# Patient Record
Sex: Female | Born: 1960 | Race: White | Hispanic: No | Marital: Single | Smoking: Never smoker
Health system: Southern US, Community
[De-identification: ages and names within clinical notes are randomized; demographics above are authoritative.]

## PROBLEM LIST (undated history)

## (undated) DIAGNOSIS — E079 Disorder of thyroid, unspecified: Secondary | ICD-10-CM

## (undated) DIAGNOSIS — I10 Essential (primary) hypertension: Secondary | ICD-10-CM

## (undated) HISTORY — PX: CHOLECYSTECTOMY: SHX55

---

## 2013-09-18 ENCOUNTER — Ambulatory Visit: Payer: Self-pay | Admitting: Physician Assistant

## 2016-06-23 ENCOUNTER — Ambulatory Visit (INDEPENDENT_AMBULATORY_CARE_PROVIDER_SITE_OTHER): Payer: 59

## 2016-06-23 ENCOUNTER — Encounter: Payer: Self-pay | Admitting: Emergency Medicine

## 2016-06-23 ENCOUNTER — Ambulatory Visit
Admission: EM | Admit: 2016-06-23 | Discharge: 2016-06-23 | Disposition: A | Discharge status: Home / Self Care | Payer: 59 | Attending: Emergency Medicine | Admitting: Emergency Medicine

## 2016-06-23 DIAGNOSIS — R062 Wheezing: Secondary | ICD-10-CM

## 2016-06-23 DIAGNOSIS — J22 Unspecified acute lower respiratory infection: Secondary | ICD-10-CM | POA: Diagnosis not present

## 2016-06-23 DIAGNOSIS — J9801 Acute bronchospasm: Secondary | ICD-10-CM

## 2016-06-23 HISTORY — DX: Disorder of thyroid, unspecified: E07.9

## 2016-06-23 HISTORY — DX: Essential (primary) hypertension: I10

## 2016-06-23 MED ORDER — AZITHROMYCIN 250 MG PO TABS: 250.0000 mg | ORAL_TABLET | Freq: Every day | ORAL | 0 refills | Status: DC

## 2016-06-23 MED ORDER — BENZONATATE 200 MG PO CAPS: 200.0000 mg | ORAL_CAPSULE | Freq: Three times a day (TID) | ORAL | 0 refills | Status: DC | PRN

## 2016-06-23 MED ORDER — IPRATROPIUM-ALBUTEROL 0.5-2.5 (3) MG/3ML IN SOLN
3.0000 mL | Freq: Once | RESPIRATORY_TRACT | Status: AC
Start: 2016-06-23 — End: 2016-06-23
  Administered 2016-06-23: 3 mL via RESPIRATORY_TRACT

## 2016-06-23 MED ORDER — ALBUTEROL SULFATE HFA 108 (90 BASE) MCG/ACT IN AERS: 1.0000 | INHALATION_SPRAY | RESPIRATORY_TRACT | 0 refills | Status: DC | PRN

## 2016-06-23 MED ORDER — HYDROCOD POLST-CPM POLST ER 10-8 MG/5ML PO SUER: 5.0000 mL | Freq: Two times a day (BID) | ORAL | 0 refills | Status: DC | PRN

## 2016-06-23 MED ORDER — AEROCHAMBER PLUS MISC: 2 refills | Status: DC

## 2016-06-23 MED ORDER — PREDNISONE 10 MG (21) PO TBPK: ORAL_TABLET | ORAL | 0 refills | Status: DC

## 2016-06-23 MED ORDER — FLUTICASONE PROPIONATE 50 MCG/ACT NA SUSP: 2.0000 | Freq: Every day | NASAL | 0 refills | Status: DC

## 2016-06-23 NOTE — ED Provider Notes (Signed)
HPI  SUBJECTIVE:  Donna Petersen is a 56 y.o. female who presents with cough productive of yellow mucous, chest congestion the past week. She reports clear nasal congestion, rhinorrhea, occasional sneezing. No itchy, watery eyes. She reports sinus pressure, states that her ears feel clogged and reports decreased hearing. She reports occasional left-sided ear pain. She reports wheezing at night, shortness of breath yesterday shortness of breath with exertion. She had a right-sided headache 2 days ago but states that this is since resolved. She reports fatigue secondary to coughing states that she is unable to sleep due to the cough. States that her boyfriend has identical symptoms. No fevers, recent antibiotics, antipyretic in the past 6-8 hours. No chest pain. No dental pain, facial swelling. No GERD symptoms. She tried Mucinex and increasing fluids. There are no alleviating factors. Symptoms are worse with going out to the pollen. She has a past medical history of atypical/walking pneumonia, hypertension, hypothyroidism and depression. She also has a history of allergies which get bad around this time year for which she takes Zyrtec regularly. No history of asthma, emphysema, COPD, smoking, diabetes. PMD: [facility].  Past Medical History:  Diagnosis Date  . Hypertension   . Thyroid disease     Past Surgical History:  Procedure Laterality Date  . CHOLECYSTECTOMY      History reviewed. No pertinent family history.  Social History  Substance Use Topics  . Smoking status: Never Smoker  . Smokeless tobacco: Never Used  . Alcohol use Yes    No current facility-administered medications for this encounter.   Current Outpatient Prescriptions:  .  levothyroxine (SYNTHROID, LEVOTHROID) 100 MCG tablet, Take 100 mcg by mouth daily before breakfast., Disp: , Rfl:  .  lisinopril (PRINIVIL,ZESTRIL) 10 MG tablet, Take 10 mg by mouth daily., Disp: , Rfl:  .  sertraline (ZOLOFT) 50 MG tablet, Take  50 mg by mouth daily., Disp: , Rfl:  .  albuterol (PROVENTIL HFA;VENTOLIN HFA) 108 (90 Base) MCG/ACT inhaler, Inhale 1-2 puffs into the lungs every 4 (four) hours as needed for wheezing or shortness of breath., Disp: 1 Inhaler, Rfl: 0 .  azithromycin (ZITHROMAX) 250 MG tablet, Take 1 tablet (250 mg total) by mouth daily. 2 tabs po on day 1, 1 tab po on days 2-5, Disp: 6 tablet, Rfl: 0 .  benzonatate (TESSALON) 200 MG capsule, Take 1 capsule (200 mg total) by mouth 3 (three) times daily as needed for cough., Disp: 30 capsule, Rfl: 0 .  chlorpheniramine-HYDROcodone (TUSSIONEX PENNKINETIC ER) 10-8 MG/5ML SUER, Take 5 mLs by mouth every 12 (twelve) hours as needed for cough., Disp: 120 mL, Rfl: 0 .  fluticasone (FLONASE) 50 MCG/ACT nasal spray, Place 2 sprays into both nostrils daily., Disp: 16 g, Rfl: 0 .  predniSONE (STERAPRED UNI-PAK 21 TAB) 10 MG (21) TBPK tablet, Dispense one 6 day pack. Take as directed with food., Disp: 21 tablet, Rfl: 0 .  Spacer/Aero-Holding Chambers (AEROCHAMBER PLUS) inhaler, Use as instructed, Disp: 1 each, Rfl: 2  Allergies  Allergen Reactions  . Latex Anaphylaxis     ROS  As noted in HPI.   Physical Exam  BP 112/70 (BP Location: Right Arm)   Pulse 77   Temp 98.4 F (36.9 C) (Oral)   Resp 16   Ht 5\' 6"  (1.676 m)   Wt 160 lb (72.6 kg)   SpO2 96%   BMI 25.82 kg/m   Constitutional: Well developed, well nourished, no acute distress Eyes:  EOMI, conjunctiva normal bilaterally HENT:  Normocephalic, atraumatic,mucus membranes moist. Positive clear rhinorrhea, erythematous, swollen turbinates. No sinus tenderness. TMs normal with no air-fluid levels or effusion. Positive cobblestoning, no obvious postnasal drip  Respiratory: Normal inspiratory effort, scattered rhonchi and occasional wheezing. prolonged expiratory phase. Fair air movement. No rales. No chest wall tenderness Cardiovascular: Normal rate regular rhythm no murmurs rubs or gallops GI:  nondistended skin: No rash, skin intact Musculoskeletal: no deformities Neurologic: Alert & oriented x 3, no focal neuro deficits Psychiatric: Speech and behavior appropriate   ED Course   Medications  ipratropium-albuterol (DUONEB) 0.5-2.5 (3) MG/3ML nebulizer solution 3 mL (3 mLs Nebulization Given 06/23/16 1120)    Orders Placed This Encounter  Procedures  . DG Chest 2 View    Standing Status:   Standing    Number of Occurrences:   1    Order Specific Question:   Reason for Exam (SYMPTOM  OR DIAGNOSIS REQUIRED)    Answer:   r/o PNA    No results found for this or any previous visit (from the past 24 hour(s)). Dg Chest 2 View  Result Date: 06/23/2016 CLINICAL DATA:  Cough and congestion for 1 week. EXAM: CHEST  2 VIEW COMPARISON:  None. FINDINGS: The cardiomediastinal silhouette is unremarkable. There is no evidence of focal airspace disease, pulmonary edema, suspicious pulmonary nodule/mass, pleural effusion, or pneumothorax. No acute bony abnormalities are identified. IMPRESSION: No active cardiopulmonary disease. Electronically Signed   By: Harmon Pier M.D.   On: 06/23/2016 11:56    ED Clinical Impression  LRTI (lower respiratory tract infection)  Bronchospasm   ED Assessment/Plan  Presentation consistent with bronchitis/lower respiratory tract infection. This could also be bronchospasm from her allergies. She has no evidence of sinusitis or otitis. We will check a chest x-ray to rule out pneumonia. Also giving DuoNeb here because of the wheezing and rhonchi. We will reevaluate.  X-ray independently reviewed. Negative for pneumonia. See radiology report for details.  on reevaluation, patient states that she feels significantly better. Improved air movement. Positive expiratory rhonchi and wheezing bilaterally.  Plan to send home with Flonase, Tussionex, Tessalon, albuterol inhaler with a spacer. She will discontinue the Mucinex because it has not been working for her to  restart her Zyrtec. We'll send home with some prednisone, and a wait-and-see prescription of azithromycin if she does not get better with these medicines. Follow-up with PMD in 3 days. Go to the ER if she gets worse.   Discussed imaging, MDM, plan and followup with patient. Discussed sn/sx that should prompt return to the ED. Patient agrees with plan.   Meds ordered this encounter  Medications  . lisinopril (PRINIVIL,ZESTRIL) 10 MG tablet    Sig: Take 10 mg by mouth daily.  Marland Kitchen levothyroxine (SYNTHROID, LEVOTHROID) 100 MCG tablet    Sig: Take 100 mcg by mouth daily before breakfast.  . sertraline (ZOLOFT) 50 MG tablet    Sig: Take 50 mg by mouth daily.  Marland Kitchen ipratropium-albuterol (DUONEB) 0.5-2.5 (3) MG/3ML nebulizer solution 3 mL  . albuterol (PROVENTIL HFA;VENTOLIN HFA) 108 (90 Base) MCG/ACT inhaler    Sig: Inhale 1-2 puffs into the lungs every 4 (four) hours as needed for wheezing or shortness of breath.    Dispense:  1 Inhaler    Refill:  0  . Spacer/Aero-Holding Chambers (AEROCHAMBER PLUS) inhaler    Sig: Use as instructed    Dispense:  1 each    Refill:  2  . predniSONE (STERAPRED UNI-PAK 21 TAB) 10 MG (21) TBPK tablet  Sig: Dispense one 6 day pack. Take as directed with food.    Dispense:  21 tablet    Refill:  0  . chlorpheniramine-HYDROcodone (TUSSIONEX PENNKINETIC ER) 10-8 MG/5ML SUER    Sig: Take 5 mLs by mouth every 12 (twelve) hours as needed for cough.    Dispense:  120 mL    Refill:  0  . benzonatate (TESSALON) 200 MG capsule    Sig: Take 1 capsule (200 mg total) by mouth 3 (three) times daily as needed for cough.    Dispense:  30 capsule    Refill:  0  . fluticasone (FLONASE) 50 MCG/ACT nasal spray    Sig: Place 2 sprays into both nostrils daily.    Dispense:  16 g    Refill:  0  . azithromycin (ZITHROMAX) 250 MG tablet    Sig: Take 1 tablet (250 mg total) by mouth daily. 2 tabs po on day 1, 1 tab po on days 2-5    Dispense:  6 tablet    Refill:  0    *This  clinic note was created using Scientist, clinical (histocompatibility and immunogenetics). Therefore, there may be occasional mistakes despite careful proofreading.  ?   Domenick Gong, MD 06/23/16 1215

## 2016-06-23 NOTE — ED Triage Notes (Signed)
Patient c/o cough and chest congestion for a week.  

## 2016-06-23 NOTE — Discharge Instructions (Signed)
Flonase and saline nasal irrigation for your nasal congestion and postnasal drip. Tussionex for the cough at night, Tessalon for cough during the day, 1-2 puffs from your albuterol inhaler with the spacer. As needed for coughing and wheezing.  discontinue the Mucinex because it has not been working for you to restart your Zyrtec. Try the prednisone as well. If you're not getting better with all of these medicines, then start the azithromycin. Follow-up with your PMD in 3 days. Go to the ER if you get worse.

## 2016-07-12 ENCOUNTER — Ambulatory Visit
Admission: EM | Admit: 2016-07-12 | Discharge: 2016-07-12 | Disposition: A | Discharge status: Home / Self Care | Payer: 59 | Attending: Internal Medicine | Admitting: Internal Medicine

## 2016-07-12 ENCOUNTER — Ambulatory Visit (INDEPENDENT_AMBULATORY_CARE_PROVIDER_SITE_OTHER): Payer: 59

## 2016-07-12 ENCOUNTER — Ambulatory Visit: Payer: 59

## 2016-07-12 DIAGNOSIS — T07XXXA Unspecified multiple injuries, initial encounter: Secondary | ICD-10-CM

## 2016-07-12 DIAGNOSIS — S2241XA Multiple fractures of ribs, right side, initial encounter for closed fracture: Secondary | ICD-10-CM | POA: Diagnosis not present

## 2016-07-12 DIAGNOSIS — R0789 Other chest pain: Secondary | ICD-10-CM

## 2016-07-12 MED ORDER — HYDROCODONE-ACETAMINOPHEN 5-325 MG PO TABS: 1.0000 | ORAL_TABLET | Freq: Four times a day (QID) | ORAL | 0 refills | Status: DC | PRN

## 2016-07-12 MED ORDER — NAPROXEN 500 MG PO TABS: 500.0000 mg | ORAL_TABLET | Freq: Two times a day (BID) | ORAL | 0 refills | Status: AC

## 2016-07-12 NOTE — Discharge Instructions (Signed)
-  Naprosyn 500mg : twice a day as needed for pain -Norco: every 6 hours as needed for pain not relieved with naprosyn -deep breathing exercises to help prevent pneumonia -avoid aggravating movements -follow up with PCP -can return to clinic or PCP should symptoms not improve or worsen or you develop fever. -report to ER should you develop shortness of breath
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
## 2016-12-12 ENCOUNTER — Encounter: Payer: Self-pay | Admitting: Emergency Medicine

## 2016-12-12 ENCOUNTER — Ambulatory Visit
Admission: EM | Admit: 2016-12-12 | Discharge: 2016-12-12 | Disposition: A | Discharge status: Home / Self Care | Payer: 59 | Attending: Family Medicine | Admitting: Family Medicine

## 2016-12-12 DIAGNOSIS — J029 Acute pharyngitis, unspecified: Secondary | ICD-10-CM

## 2016-12-12 DIAGNOSIS — R05 Cough: Secondary | ICD-10-CM

## 2016-12-12 DIAGNOSIS — J069 Acute upper respiratory infection, unspecified: Secondary | ICD-10-CM

## 2016-12-12 MED ORDER — AZITHROMYCIN 250 MG PO TABS: ORAL_TABLET | ORAL | 0 refills | Status: DC

## 2016-12-12 MED ORDER — HYDROCOD POLST-CPM POLST ER 10-8 MG/5ML PO SUER
5.0000 mL | Freq: Two times a day (BID) | ORAL | 0 refills | Status: DC
Start: 2016-12-12 — End: 2020-05-14

## 2016-12-12 MED ORDER — FLUTICASONE PROPIONATE 50 MCG/ACT NA SUSP: 2.0000 | Freq: Every day | NASAL | 0 refills | Status: DC

## 2016-12-12 MED ORDER — BENZONATATE 200 MG PO CAPS: ORAL_CAPSULE | ORAL | 0 refills | Status: DC

## 2016-12-12 NOTE — ED Triage Notes (Signed)
Patient c/o cough and chest congestion since Saturday.   

## 2016-12-12 NOTE — ED Provider Notes (Addendum)
[facility]    CSN: [id number] Arrival date & time: 12/12/16  0857     History   Chief Complaint Chief Complaint  Patient presents with  . Cough    HPI Donna Petersen is a 56 y.o. female.   HPI  56 year old female presents with cough and congestion that started on Saturday 5 days prior to this visit. Not had any fever or chills. The cough and mucous discharge is productive of greenish sputum. She is a nonsmoker. Her boyfriend has the same symptoms. She is having difficulty sleeping at night because of the significant coughing. She is tending to sleep during the daytime in a semi-reclining position. Been taking Mucinex and over-the-counter preparations but they have not been effective.        Past Medical History:  Diagnosis Date  . Hypertension   . Thyroid disease     There are no active problems to display for this patient.   Past Surgical History:  Procedure Laterality Date  . CHOLECYSTECTOMY      OB History    No data available       Home Medications    Prior to Admission medications   Medication Sig Start Date End Date Taking? Authorizing Provider  albuterol (PROVENTIL HFA;VENTOLIN HFA) 108 (90 Base) MCG/ACT inhaler Inhale 1-2 puffs into the lungs every 4 (four) hours as needed for wheezing or shortness of breath. 06/23/16   Domenick Gong, MD  azithromycin (ZITHROMAX Z-PAK) 250 MG tablet Use as per package instructions 12/12/16   Lutricia Feil, PA-C  benzonatate (TESSALON) 200 MG capsule Take one cap TID PRN cough 12/12/16   Lutricia Feil, PA-C  chlorpheniramine-HYDROcodone [facility]) 10-8 MG/5ML SUER Take 5 mLs by mouth 2 (two) times daily. 12/12/16   Lutricia Feil, PA-C  fluticasone (FLONASE) 50 MCG/ACT nasal spray Place 2 sprays into both nostrils daily. 12/12/16   Lutricia Feil, PA-C  fluticasone (FLONASE) 50 MCG/ACT nasal spray Place 2 sprays into both nostrils daily. 12/12/16   Lutricia Feil, PA-C    HYDROcodone-acetaminophen (NORCO) 5-325 MG tablet Take 1 tablet by mouth every 6 (six) hours as needed for moderate pain or severe pain (not relieved with naprosyn). 07/12/16   Candis Schatz, PA-C  levothyroxine (SYNTHROID, LEVOTHROID) 100 MCG tablet Take 100 mcg by mouth daily before breakfast.    [provider]  lisinopril (PRINIVIL,ZESTRIL) 10 MG tablet Take 10 mg by mouth daily.    [provider]  naproxen (NAPROSYN) 500 MG tablet Take 1 tablet (500 mg total) by mouth 2 (two) times daily with a meal. 07/12/16   Candis Schatz, PA-C  sertraline (ZOLOFT) 50 MG tablet Take 50 mg by mouth daily.    [provider]  Spacer/Aero-Holding Chambers (AEROCHAMBER PLUS) inhaler Use as instructed 06/23/16   Domenick Gong, MD    Family History History reviewed. No pertinent family history.  Social History Social History  Substance Use Topics  . Smoking status: Never Smoker  . Smokeless tobacco: Never Used  . Alcohol use Yes     Allergies   Latex   Review of Systems Review of Systems  Constitutional: Positive for activity change. Negative for chills, fatigue and fever.  HENT: Positive for congestion, postnasal drip, rhinorrhea, sinus pain, sinus pressure and sore throat.   Respiratory: Positive for cough. Negative for shortness of breath, wheezing and stridor.   All other systems reviewed and are negative.    Physical Exam Triage Vital Signs ED Triage  Vitals  Enc Vitals Group     BP 12/12/16 0918 140/73     Pulse Rate 12/12/16 0918 64     Resp 12/12/16 0918 14     Temp 12/12/16 0918 98.3 F (36.8 C)     Temp Source 12/12/16 0918 Oral     SpO2 12/12/16 0918 99 %     Weight 12/12/16 0916 162 lb 3.2 oz (73.6 kg)     Height 12/12/16 0916 5\' 6"  (1.676 m)     Head Circumference --      Peak Flow --      Pain Score 12/12/16 0916 0     Pain Loc --      Pain Edu? --      Excl. in GC? --    No data found.   Updated Vital Signs BP 140/73 (BP  Location: Left Arm)   Pulse 64   Temp 98.3 F (36.8 C) (Oral)   Resp 14   Ht 5\' 6"  (1.676 m)   Wt 162 lb 3.2 oz (73.6 kg)   SpO2 99%   BMI 26.18 kg/m   Visual Acuity Right Eye Distance:   Left Eye Distance:   Bilateral Distance:    Right Eye Near:   Left Eye Near:    Bilateral Near:     Physical Exam  Constitutional: She is oriented to person, place, and time. She appears well-developed and well-nourished. No distress.  HENT:  Head: Normocephalic.  Right Ear: External ear normal.  Left Ear: External ear normal.  Nose: Nose normal.  Mouth/Throat: Oropharynx is clear and moist. No oropharyngeal exudate.  Eyes: Pupils are equal, round, and reactive to light. Right eye exhibits no discharge. Left eye exhibits no discharge.  Neck: Normal range of motion.  Pulmonary/Chest: Effort normal and breath sounds normal.  Musculoskeletal: Normal range of motion.  Lymphadenopathy:    She has no cervical adenopathy.  Neurological: She is alert and oriented to person, place, and time.  Skin: Skin is warm and dry. She is not diaphoretic.  Psychiatric: She has a normal mood and affect. Her behavior is normal. Judgment and thought content normal.  Nursing note and vitals reviewed.    UC Treatments / Results  Labs (all labs ordered are listed, but only abnormal results are displayed) Labs Reviewed - No data to display  EKG  EKG Interpretation None       Radiology No results found.  Procedures Procedures (including critical care time)  Medications Ordered in UC Medications - No data to display   Initial Impression / Assessment and Plan / UC Course  I have reviewed the triage vital signs and the nursing notes.  Pertinent labs & imaging results that were available during my care of the patient were reviewed by me and considered in my medical decision making (see chart for details).     Plan: 1. Test/x-ray results and diagnosis reviewed with patient 2. rx as per orders;  risks, benefits, potential side effects reviewed with patient 3. Recommend supportive treatment with rest and fluids. Use Tussionex at nighttime for rest. She is cautioned regarding performing activities are her concentration are judgment and not driving while taking the medication. Also prescribed azithromycin that she may take in a 45 days if she continues to have symptoms. I told her this is very likely a viral illness that will run its course however. If she is not improving she should follow-up with her primary care physician. 4. F/u prn if symptoms worsen or  don't improve   Final Clinical Impressions(s) / UC Diagnoses   Final diagnoses:  Upper respiratory tract infection, unspecified type    New Prescriptions Discharge Medication List as of 12/12/2016  9:54 AM    START taking these medications   Details  azithromycin (ZITHROMAX Z-PAK) 250 MG tablet Use as per package instructions, Normal    benzonatate (TESSALON) 200 MG capsule Take one cap TID PRN cough, Normal    chlorpheniramine-HYDROcodone (TUSSIONEX PENNKINETIC ER) 10-8 MG/5ML SUER Take 5 mLs by mouth 2 (two) times daily., Starting Thu 12/12/2016, Print         Controlled Substance Prescriptions [place] Controlled Substance Registry consulted? Not Applicable   Lutricia FeilRoemer, Esperansa Sarabia P, PA-C 12/12/16 1003    Lutricia Feiloemer, Tinita Brooker P, PA-C 12/12/16 1004

## 2016-12-23 ENCOUNTER — Encounter: Payer: Self-pay | Admitting: *Deleted

## 2016-12-23 ENCOUNTER — Ambulatory Visit
Admission: EM | Admit: 2016-12-23 | Discharge: 2016-12-23 | Disposition: A | Discharge status: Home / Self Care | Payer: Self-pay | Attending: Family Medicine | Admitting: Family Medicine

## 2016-12-23 DIAGNOSIS — J029 Acute pharyngitis, unspecified: Secondary | ICD-10-CM

## 2016-12-23 DIAGNOSIS — H6503 Acute serous otitis media, bilateral: Secondary | ICD-10-CM

## 2016-12-23 DIAGNOSIS — R05 Cough: Secondary | ICD-10-CM

## 2016-12-23 DIAGNOSIS — J0100 Acute maxillary sinusitis, unspecified: Secondary | ICD-10-CM

## 2016-12-23 LAB — RAPID STREP SCREEN (MED CTR MEBANE ONLY): Streptococcus, Group A Screen (Direct): NEGATIVE

## 2016-12-23 MED ORDER — AMOXICILLIN 875 MG PO TABS: 875.0000 mg | ORAL_TABLET | Freq: Two times a day (BID) | ORAL | 0 refills | Status: DC

## 2016-12-23 NOTE — ED Triage Notes (Signed)
Sore throat, non-productive cough, head and chest congestion, x2 weeks. Denies fever.

## 2016-12-23 NOTE — ED Provider Notes (Signed)
[facility]    CSN: [id number] Arrival date & time: 12/23/16  1048     History   Chief Complaint Chief Complaint  Patient presents with  . Sore Throat  . Cough  . Nasal Congestion    HPI Donna Petersen is a 56 y.o. female.   The history is provided by the patient.  Sore Throat   Cough  Associated symptoms: ear pain and sore throat   Associated symptoms: no wheezing   URI  Presenting symptoms: congestion, cough, ear pain, facial pain and sore throat   Severity:  Moderate Onset quality:  Sudden Duration:  1 week Timing:  Constant Progression:  Worsening Chronicity:  New Relieved by:  Nothing Ineffective treatments:  OTC medications Associated symptoms: sinus pain   Associated symptoms: no wheezing     Past Medical History:  Diagnosis Date  . Hypertension   . Thyroid disease     There are no active problems to display for this patient.   Past Surgical History:  Procedure Laterality Date  . CHOLECYSTECTOMY      OB History    No data available       Home Medications    Prior to Admission medications   Medication Sig Start Date End Date Taking? Authorizing Provider  fluticasone (FLONASE) 50 MCG/ACT nasal spray Place 2 sprays into both nostrils daily. 12/12/16  Yes Lutricia Feil, PA-C  levothyroxine (SYNTHROID, LEVOTHROID) 100 MCG tablet Take 100 mcg by mouth daily before breakfast.   Yes [provider]  lisinopril (PRINIVIL,ZESTRIL) 10 MG tablet Take 10 mg by mouth daily.   Yes [provider]  sertraline (ZOLOFT) 50 MG tablet Take 50 mg by mouth daily.   Yes [provider]  albuterol (PROVENTIL HFA;VENTOLIN HFA) 108 (90 Base) MCG/ACT inhaler Inhale 1-2 puffs into the lungs every 4 (four) hours as needed for wheezing or shortness of breath. 06/23/16   Domenick Gong, MD  amoxicillin (AMOXIL) 875 MG tablet Take 1 tablet (875 mg total) 2 (two) times daily by mouth. 12/23/16   Payton Mccallum, MD  azithromycin  (ZITHROMAX Z-PAK) 250 MG tablet Use as per package instructions 12/12/16   Lutricia Feil, PA-C  benzonatate (TESSALON) 200 MG capsule Take one cap TID PRN cough 12/12/16   Lutricia Feil, PA-C  chlorpheniramine-HYDROcodone (TUSSIONEX PENNKINETIC ER) 10-8 MG/5ML SUER Take 5 mLs by mouth 2 (two) times daily. 12/12/16   Lutricia Feil, PA-C  fluticasone (FLONASE) 50 MCG/ACT nasal spray Place 2 sprays into both nostrils daily. 12/12/16   Lutricia Feil, PA-C  HYDROcodone-acetaminophen (NORCO) 5-325 MG tablet Take 1 tablet by mouth every 6 (six) hours as needed for moderate pain or severe pain (not relieved with naprosyn). 07/12/16   Candis Schatz, PA-C  naproxen (NAPROSYN) 500 MG tablet Take 1 tablet (500 mg total) by mouth 2 (two) times daily with a meal. 07/12/16   Candis Schatz, PA-C  Spacer/Aero-Holding Chambers (AEROCHAMBER PLUS) inhaler Use as instructed 06/23/16   Domenick Gong, MD    Family History History reviewed. No pertinent family history.  Social History Social History   Tobacco Use  . Smoking status: Never Smoker  . Smokeless tobacco: Never Used  Substance Use Topics  . Alcohol use: Yes  . Drug use: No     Allergies   Latex   Review of Systems Review of Systems  HENT: Positive for congestion, ear pain, sinus pain and sore throat.   Respiratory: Positive for cough. Negative for wheezing.  Physical Exam Triage Vital Signs ED Triage Vitals  Enc Vitals Group     BP 12/23/16 1137 (!) 175/87     Pulse Rate 12/23/16 1137 65     Resp 12/23/16 1137 16     Temp 12/23/16 1137 98.7 F (37.1 C)     Temp Source 12/23/16 1137 Oral     SpO2 12/23/16 1137 100 %     Weight --      Height --      Head Circumference --      Peak Flow --      Pain Score 12/23/16 1140 4     Pain Loc --      Pain Edu? --      Excl. in GC? --    No data found.  Updated Vital Signs BP (!) 175/87 (BP Location: Left Arm)   Pulse 65   Temp 98.7 F (37.1 C) (Oral)    Resp 16   SpO2 100%   Visual Acuity Right Eye Distance:   Left Eye Distance:   Bilateral Distance:    Right Eye Near:   Left Eye Near:    Bilateral Near:     Physical Exam  Constitutional: She appears well-developed and well-nourished. No distress.  HENT:  Head: Normocephalic and atraumatic.  Right Ear: External ear and ear canal normal. Tympanic membrane is erythematous and bulging.  Left Ear: External ear and ear canal normal. Tympanic membrane is erythematous and bulging.  Nose: Mucosal edema and rhinorrhea present. No nose lacerations, sinus tenderness, nasal deformity, septal deviation or nasal septal hematoma. No epistaxis.  No foreign bodies. Right sinus exhibits maxillary sinus tenderness and frontal sinus tenderness. Left sinus exhibits maxillary sinus tenderness and frontal sinus tenderness.  Mouth/Throat: Uvula is midline, oropharynx is clear and moist and mucous membranes are normal. No oropharyngeal exudate.  Eyes: Conjunctivae and EOM are normal. Pupils are equal, round, and reactive to light. Right eye exhibits no discharge. Left eye exhibits no discharge. No scleral icterus.  Neck: Normal range of motion. Neck supple. No thyromegaly present.  Cardiovascular: Normal rate, regular rhythm and normal heart sounds.  Pulmonary/Chest: Effort normal and breath sounds normal. No respiratory distress. She has no wheezes. She has no rales.  Lymphadenopathy:    She has no cervical adenopathy.  Skin: She is not diaphoretic.  Nursing note and vitals reviewed.    UC Treatments / Results  Labs (all labs ordered are listed, but only abnormal results are displayed) Labs Reviewed  RAPID STREP SCREEN (NOT AT [facility])  CULTURE, GROUP A STREP [facility])    EKG  EKG Interpretation None       Radiology No results found.  Procedures Procedures (including critical care time)  Medications Ordered in UC Medications - No data to display   Initial Impression / Assessment and Plan  / UC Course  I have reviewed the triage vital signs and the nursing notes.  Pertinent labs & imaging results that were available during my care of the patient were reviewed by me and considered in my medical decision making (see chart for details).       Final Clinical Impressions(s) / UC Diagnoses   Final diagnoses:  Acute maxillary sinusitis, recurrence not specified  Bilateral acute serous otitis media, recurrence not specified    New Prescriptions This SmartLink is deprecated. Use AVSMEDLIST instead to display the medication list for a patient.   1. diagnosis reviewed with patient 2. rx as per orders above; reviewed possible side effects,  interactions, risks and benefits  3.  Follow-up prn if symptoms worsen or don't improve Controlled Substance Prescriptions [place] Controlled Substance Registry consulted? Not Applicable   Payton Mccallum, MD 12/23/16 1309

## 2016-12-26 LAB — CULTURE, GROUP A STREP (THRC)

## 2020-05-14 ENCOUNTER — Encounter: Payer: Self-pay | Admitting: Emergency Medicine

## 2020-05-14 ENCOUNTER — Ambulatory Visit (INDEPENDENT_AMBULATORY_CARE_PROVIDER_SITE_OTHER): Payer: Self-pay

## 2020-05-14 ENCOUNTER — Ambulatory Visit
Admission: EM | Admit: 2020-05-14 | Discharge: 2020-05-14 | Disposition: A | Discharge status: Home / Self Care | Payer: Self-pay | Attending: Family Medicine | Admitting: Family Medicine

## 2020-05-14 ENCOUNTER — Other Ambulatory Visit: Payer: Self-pay

## 2020-05-14 DIAGNOSIS — S82852A Displaced trimalleolar fracture of left lower leg, initial encounter for closed fracture: Secondary | ICD-10-CM

## 2020-05-14 NOTE — ED Triage Notes (Signed)
Patient states that she was getting out of her car last night and rolled her left ankle.  Patient c/o swelling and pain in her left ankle.

## 2020-05-14 NOTE — ED Provider Notes (Signed)
[facility]    CSN: [id number] Arrival date & time: 05/14/20  0930      History   Chief Complaint Chief Complaint  Patient presents with  . Ankle Pain    left    HPI Donna Petersen is a 60 y.o. female.   HPI   60 year old female here for evaluation of left ankle pain.  Patient reports that she was getting out of her car last night and rolled her ankle.  She states that she was unable to bear weight after the incident and is still unable to bear weight.  Patient also has numbness and tingling in her toes.  Past Medical History:  Diagnosis Date  . Hypertension   . Thyroid disease     There are no problems to display for this patient.   Past Surgical History:  Procedure Laterality Date  . CHOLECYSTECTOMY      OB History   No obstetric history on file.      Home Medications    Prior to Admission medications   Medication Sig Start Date End Date Taking? Authorizing Provider  levothyroxine (SYNTHROID, LEVOTHROID) 100 MCG tablet Take 100 mcg by mouth daily before breakfast.   Yes [provider]  lisinopril (PRINIVIL,ZESTRIL) 10 MG tablet Take 10 mg by mouth daily.   Yes [provider]  sertraline (ZOLOFT) 50 MG tablet Take 50 mg by mouth daily.   Yes [provider]  benzonatate (TESSALON) 200 MG capsule Take one cap TID PRN cough 12/12/16   Lutricia Feil, PA-C  naproxen (NAPROSYN) 500 MG tablet Take 1 tablet (500 mg total) by mouth 2 (two) times daily with a meal. 07/12/16   Candis Schatz, PA-C  albuterol (PROVENTIL HFA;VENTOLIN HFA) 108 (90 Base) MCG/ACT inhaler Inhale 1-2 puffs into the lungs every 4 (four) hours as needed for wheezing or shortness of breath. 06/23/16 05/14/20  Domenick Gong, MD  fluticasone (FLONASE) 50 MCG/ACT nasal spray Place 2 sprays into both nostrils daily. 12/12/16 05/14/20  Lutricia Feil, PA-C    Family History History reviewed. No pertinent family history.  Social History Social  History   Tobacco Use  . Smoking status: Never Smoker  . Smokeless tobacco: Never Used  Vaping Use  . Vaping Use: Never used  Substance Use Topics  . Alcohol use: Yes  . Drug use: No     Allergies   Latex   Review of Systems Review of Systems  Musculoskeletal: Positive for arthralgias and joint swelling.  Skin: Positive for color change. Negative for wound.  Neurological: Positive for numbness. Negative for weakness.  Hematological: Negative.   Psychiatric/Behavioral: Negative.      Physical Exam Triage Vital Signs ED Triage Vitals [05/14/20 0939]  Enc Vitals Group     BP      Pulse      Resp      Temp      Temp src      SpO2      Weight 155 lb (70.3 kg)     Height 5\' 6"  (1.676 m)     Head Circumference      Peak Flow      Pain Score 8     Pain Loc      Pain Edu?      Excl. in GC?    No data found.  Updated Vital Signs BP (!) 142/73 (BP Location: Left Arm)   Pulse 72   Temp 98 F (36.7 C) (Oral)  Resp 15   Ht 5\' 6"  (1.676 m)   Wt 155 lb (70.3 kg)   SpO2 99%   BMI 25.02 kg/m   Visual Acuity Right Eye Distance:   Left Eye Distance:   Bilateral Distance:    Right Eye Near:   Left Eye Near:    Bilateral Near:     Physical Exam Vitals and nursing note reviewed.  Constitutional:      General: She is in acute distress.     Appearance: Normal appearance.  HENT:     Head: Normocephalic and atraumatic.  Cardiovascular:     Rate and Rhythm: Normal rate and regular rhythm.     Pulses: Normal pulses.     Heart sounds: Normal heart sounds. No murmur heard. No gallop.   Pulmonary:     Effort: Pulmonary effort is normal.     Breath sounds: Normal breath sounds. No wheezing, rhonchi or rales.  Musculoskeletal:        General: Swelling and tenderness present. No deformity or signs of injury.  Skin:    General: Skin is warm and dry.     Capillary Refill: Capillary refill takes less than 2 seconds.     Findings: Bruising present. No erythema.   Neurological:     General: No focal deficit present.     Mental Status: She is alert and oriented to person, place, and time.  Psychiatric:        Mood and Affect: Mood normal.        Behavior: Behavior normal.        Thought Content: Thought content normal.        Judgment: Judgment normal.      UC Treatments / Results  Labs (all labs ordered are listed, but only abnormal results are displayed) Labs Reviewed - No data to display  EKG   Radiology DG Tibia/Fibula Left  Result Date: 05/14/2020 CLINICAL DATA:  Rolled ankle last night, pain and swelling LEFT ankle, unable to bear weight EXAM: LEFT TIBIA AND FIBULA - 2 VIEW COMPARISON:  LEFT ankle radiographs 05/14/2020 FINDINGS: Displaced trimalleolar fractures LEFT ankle as seen on ankle radiographs. Remainder of tibia and fibula appear intact. Knee joint alignment normal. No additional fracture or dislocation. IMPRESSION: Displaced trimalleolar fractures LEFT ankle. No additional LEFT tibial or fibular abnormalities. Electronically Signed   By: 05/16/2020 M.D.   On: 05/14/2020 10:15   DG Ankle Complete Left  Result Date: 05/14/2020 CLINICAL DATA:  Rolled ankle last night, pain and swelling, greatest pain medially, pain radiates up LEFT leg, unable to bear weight EXAM: LEFT ANKLE COMPLETE - 3+ VIEW COMPARISON:  None FINDINGS: Osseous mineralization normal. Soft tissue swelling diffusely at LEFT ankle extending up lower leg. Transverse fracture medial malleolus, displaced laterally. Comminuted oblique lateral malleolar fracture, displaced laterally and slightly posteriorly. Lateral subluxation of talus. Minimally displaced posterior malleolar fracture. No dislocation. Tarsals intact. IMPRESSION: Mildly displaced trimalleolar fractures LEFT ankle as above. Electronically Signed   By: 05/16/2020 M.D.   On: 05/14/2020 10:14    Procedures Procedures (including critical care time)  Medications Ordered in UC Medications - No data to  display  Initial Impression / Assessment and Plan / UC Course  I have reviewed the triage vital signs and the nursing notes.  Pertinent labs & imaging results that were available during my care of the patient were reviewed by me and considered in my medical decision making (see chart for details).   Patient is a very pleasant  60 year old female who appears to be in a great deal of pain and is here for evaluation of left ankle pain.  She reports that she was getting out of her car last night and rolled her ankle in an inverted manner.  Patient reports that she tried to bear weight but was unable to after the incident and she is still unable to bear weight on her foot and ankle now.  Patient states that she has had some numbness and tingling in her toes but she has taken her sock off to look to see if there is any bruising.  She does state that it feels swollen.  Physical exam reveals a normal cardiopulmonary exam.  PT and DP pulses are 2+.  Patient's midfoot distal to the toes are cool to the touch, red, have a 6-second cap refill, and patient is complaining of tingling in her toes.  Patient has full range of motion of her toes.  Patient has ecchymosis over both malleoli and across the anterior aspect of the talar joint.  Patient has tenderness to palpation and compression of bilateral malleoli and with palpation of the talar joint.  Patient has no tenderness over midfoot with palpation and no tenderness of the head of the fifth metatarsal.  Patient does have tenderness to her distal tibia but no deformity or ecchymosis is noted.  Will obtain radiograph of left ankle and tib-fib.  Tib-fib films independently evaluated by me.  Interpretation: Patient has a comminuted distal left tibial fracture and a displaced distal medial malleolar fracture.  Remainder of tibia and fibula are intact.  Awaiting radiology overread.  Left ankle films independently evaluated by me.  Interpretation: The talus appears to be  intact.  There is comminuted fracture of the distal tibia with the anterior and posterior aspects fractured and displaced as well as a comminuted fracture of the distal fibula.  Awaiting radiology overread.  Patient's exam and imaging is consistent with a trimalleolar fracture.  This coupled with the decreased capillary refill of her distal foot indicating possible vascular compromise indicates necessity for urgent orthopedic evaluation.  Will place patient in a short leg splint and give her crutches.  Patient will be referred to the emergency department for orthopedic evaluation.  Interpretation of films is consistent with my read that this is a trimalleolar fracture.  They are reading it as mildly displaced.  Patient has elected to go to [facility] emergency department for orthopedic evaluation and definitive treatment.   Final Clinical Impressions(s) / UC Diagnoses   Final diagnoses:  Closed displaced trimalleolar fracture of left ankle, initial encounter     Discharge Instructions     Please go to the emergency department at [facility] for evaluation by orthopedics and definitive treatment of your left ankle fracture.    ED Prescriptions    None     PDMP not reviewed this encounter.   Becky Augusta, NP 05/14/20 1054

## 2020-05-14 NOTE — Discharge Instructions (Signed)
Please go to the emergency department at [facility] for evaluation by orthopedics and definitive treatment of your left ankle fracture.

## 2021-07-10 ENCOUNTER — Ambulatory Visit
Admission: EM | Admit: 2021-07-10 | Discharge: 2021-07-10 | Disposition: A | Discharge status: Home / Self Care | Payer: Self-pay | Attending: Physician Assistant | Admitting: Physician Assistant

## 2021-07-10 DIAGNOSIS — M25552 Pain in left hip: Secondary | ICD-10-CM

## 2021-07-10 MED ORDER — TIZANIDINE HCL 4 MG PO TABS: 4.0000 mg | ORAL_TABLET | Freq: Two times a day (BID) | ORAL | 0 refills | Status: AC | PRN
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
## 2021-07-10 NOTE — Discharge Instructions (Signed)
-  As we discussed, your hip pain could be due to lumbar radiculopathy or a pinched nerve in your back.  This definitely happen if you have been sitting for prolonged period of time which you have.  The other possibility is some medical bursitis of the hip. - Both conditions are treated similarly.  I have advised you to continue the naproxen 2 times a day as needed for pain relief.  Can also add Tylenol up to 1 g 3 times a day.  Can try heat and ice, muscle rubs, lidocaine patches.  I have sent a muscle relaxer to the pharmacy.  I think this can definitely help you sleep at night. - If no improvement in the next couple of weeks or worsening symptoms including increased radiation of pain down the leg, numbness, weakness in the leg, incontinence of bowel or bladder, you need to go to the ER.  Otherwise if not improving, follow-up with [facility] or your PCP.

## 2023-02-20 IMAGING — CR DG TIBIA/FIBULA 2V*L*
2 series · 2 of 2 positions shown · non-contrast
Comparison: LEFT ankle radiographs 05/14/2020

CLINICAL DATA: Rolled ankle last night, pain and swelling LEFT
ankle, unable to bear weight

EXAM:
LEFT TIBIA AND FIBULA - 2 VIEW

[tibia ap]
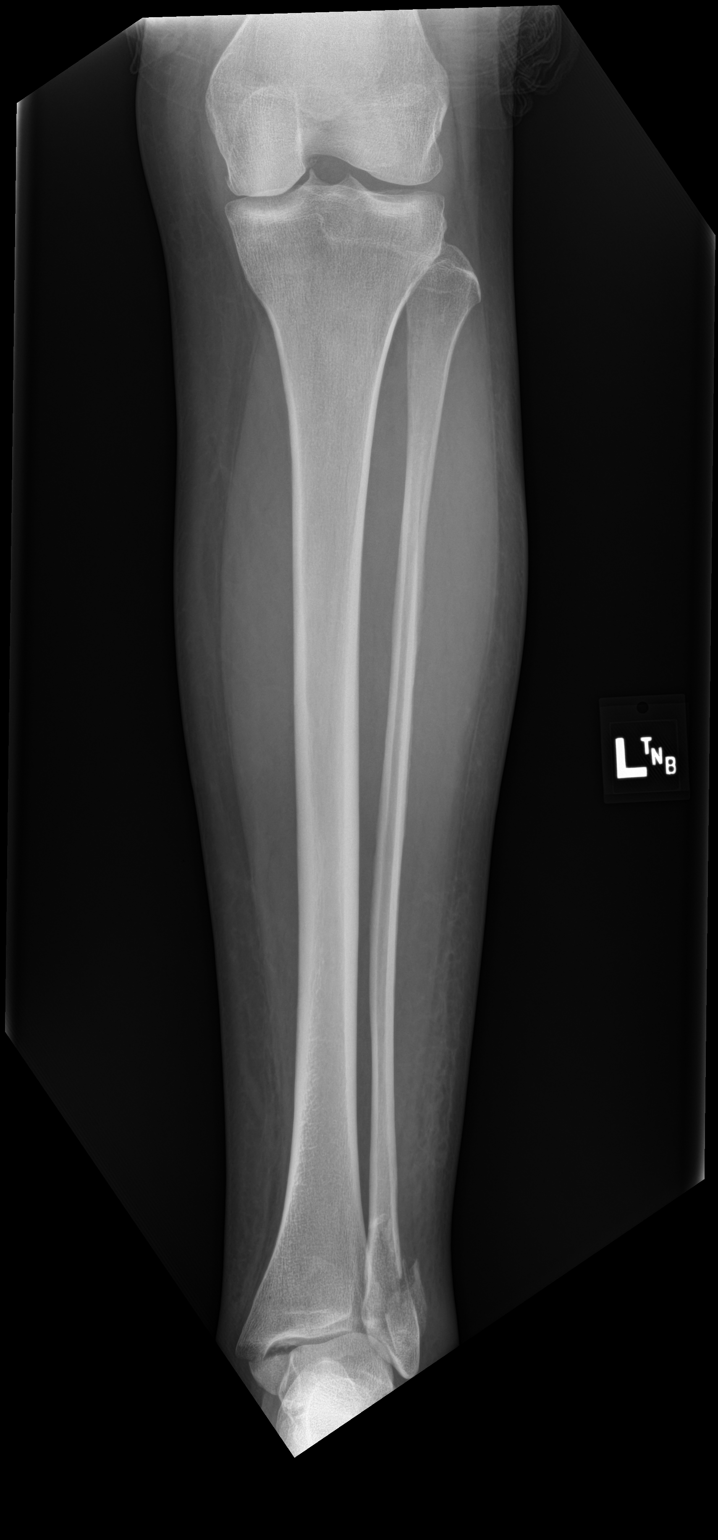

[tibia lat]
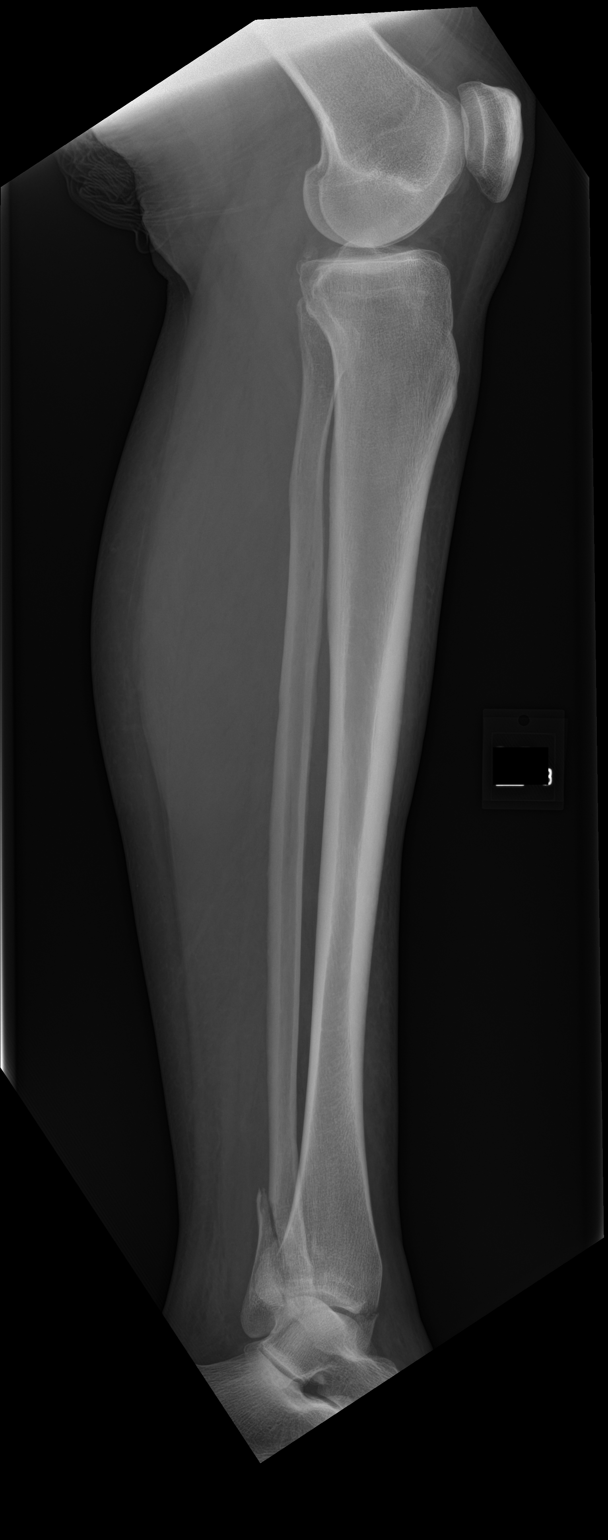

[2 of 2 positions shown; findings below may reference images not displayed]

FINDINGS: Displaced trimalleolar fractures LEFT ankle as seen on ankle
radiographs.

Remainder of tibia and fibula appear intact.

Knee joint alignment normal.

No additional fracture or dislocation.
IMPRESSION: Displaced trimalleolar fractures LEFT ankle.

No additional LEFT tibial or fibular abnormalities.

## 2023-02-20 IMAGING — CR DG ANKLE COMPLETE 3+V*L*
3 series · 3 of 3 positions shown · non-contrast
Comparison: None

CLINICAL DATA: Rolled ankle last night, pain and swelling, greatest
pain medially, pain radiates up LEFT leg, unable to bear weight

EXAM:
LEFT ANKLE COMPLETE - 3+ VIEW

[ankle ap]
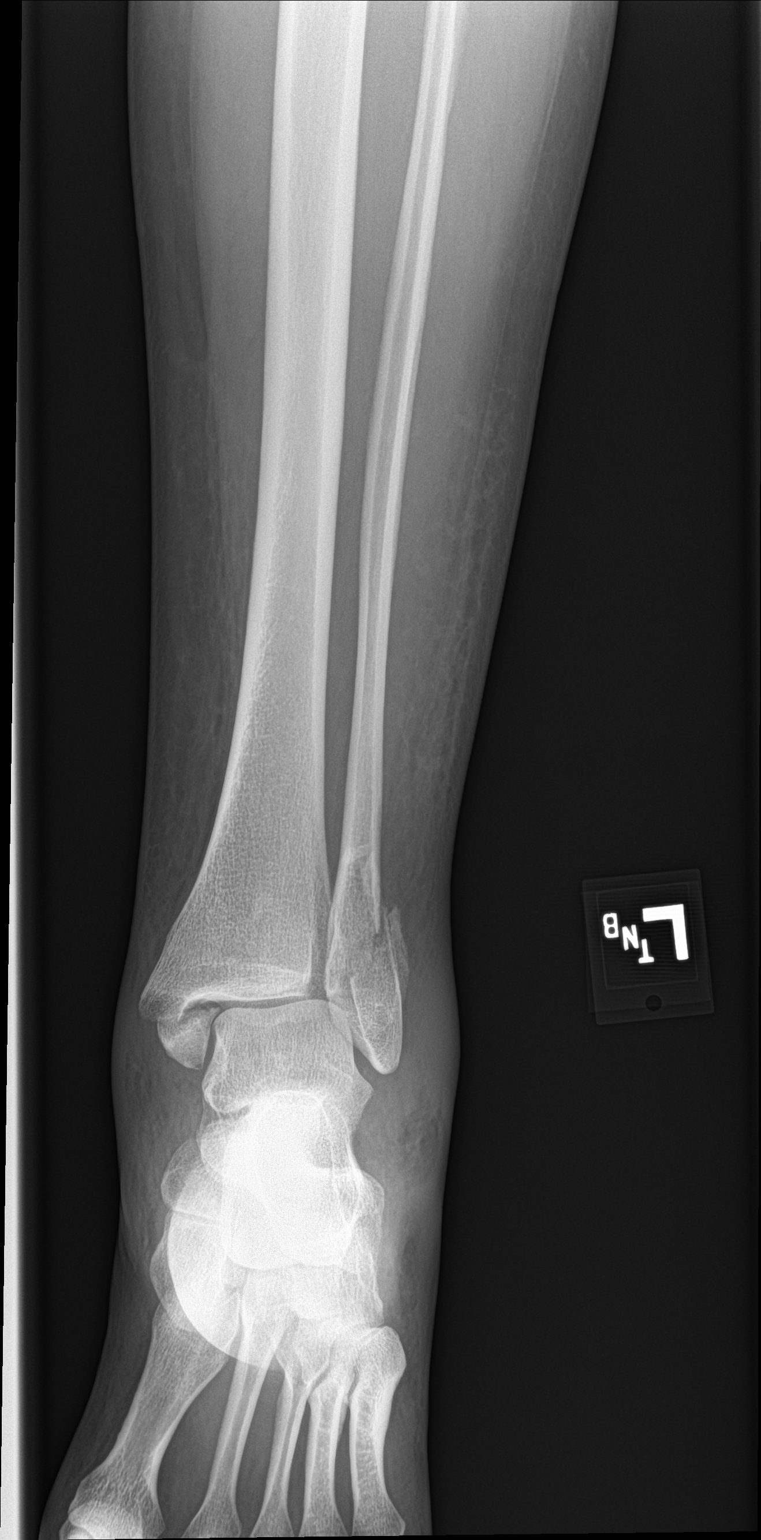

[ankle obl]
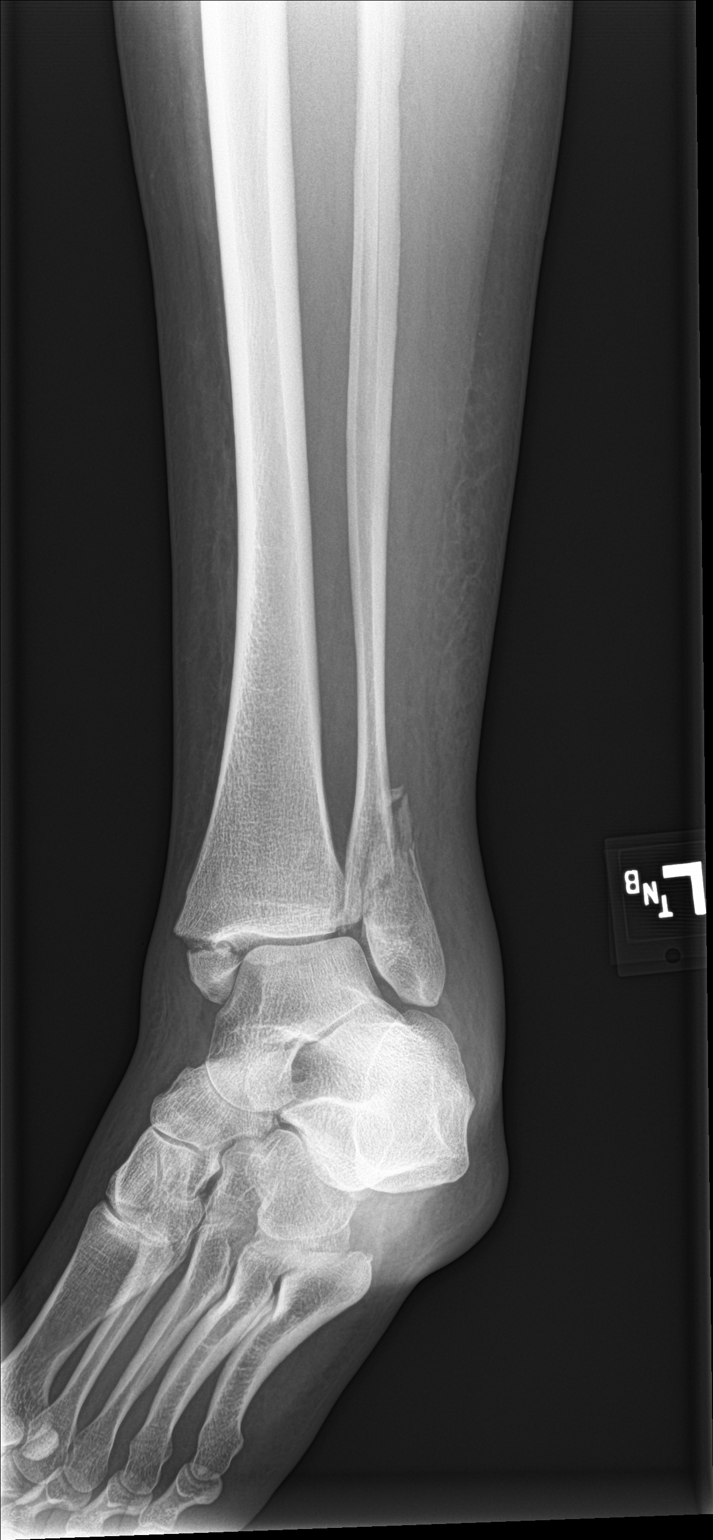

[ankle lat]
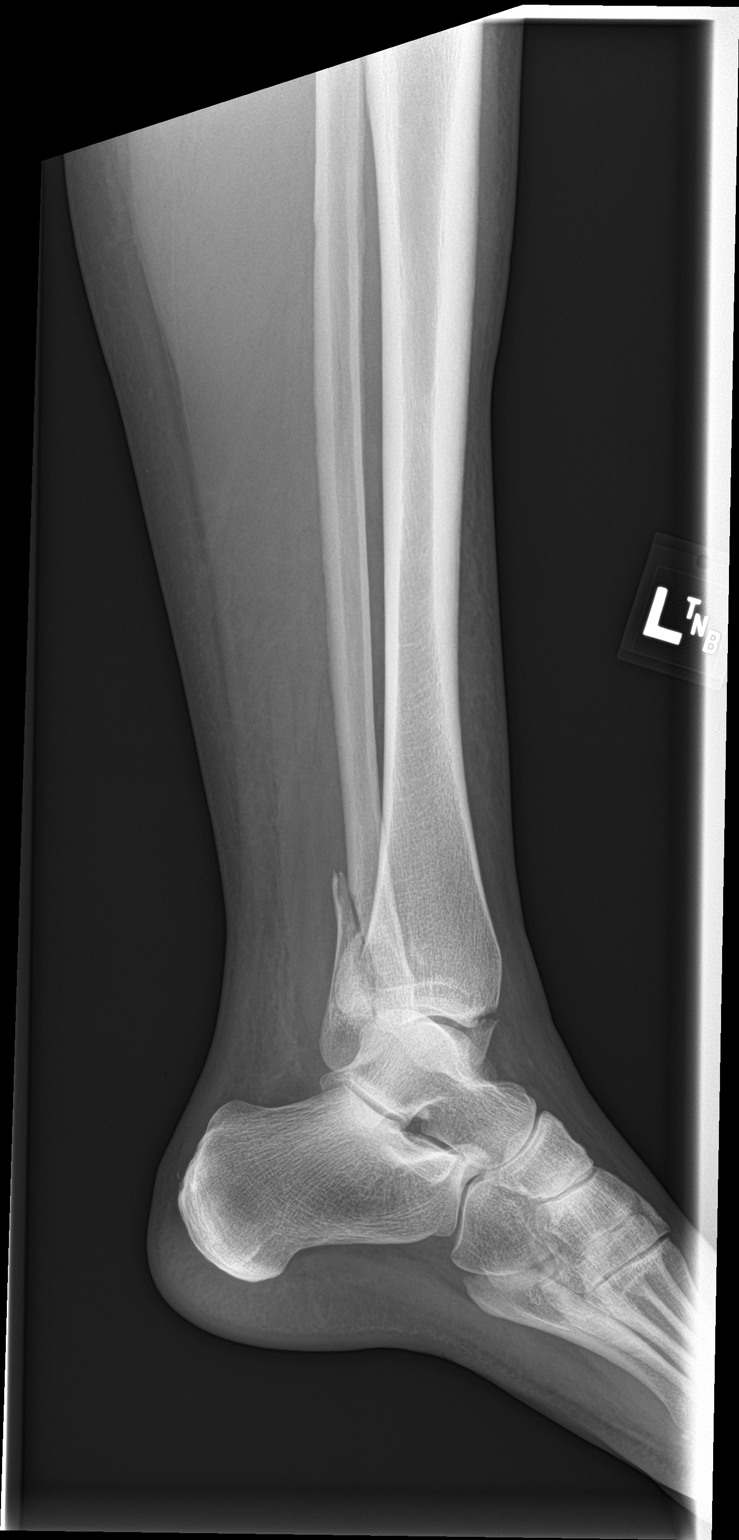

[3 of 3 positions shown; findings below may reference images not displayed]

FINDINGS: Osseous mineralization normal.

Soft tissue swelling diffusely at LEFT ankle extending up lower leg.

Transverse fracture medial malleolus, displaced laterally.

Comminuted oblique lateral malleolar fracture, displaced laterally
and slightly posteriorly.

Lateral subluxation of talus.

Minimally displaced posterior malleolar fracture.

No dislocation.

Tarsals intact.
IMPRESSION: Mildly displaced trimalleolar fractures LEFT ankle as above.

## 2023-03-07 ENCOUNTER — Other Ambulatory Visit: Payer: Self-pay

## 2023-03-07 ENCOUNTER — Emergency Department: Payer: Self-pay

## 2023-03-07 DIAGNOSIS — R4689 Other symptoms and signs involving appearance and behavior: Secondary | ICD-10-CM | POA: Diagnosis present

## 2023-03-07 DIAGNOSIS — F1010 Alcohol abuse, uncomplicated: Secondary | ICD-10-CM | POA: Insufficient documentation

## 2023-03-07 DIAGNOSIS — R45851 Suicidal ideations: Secondary | ICD-10-CM | POA: Diagnosis not present

## 2023-03-07 DIAGNOSIS — Z91040 Latex allergy status: Secondary | ICD-10-CM | POA: Insufficient documentation

## 2023-03-07 DIAGNOSIS — I10 Essential (primary) hypertension: Secondary | ICD-10-CM | POA: Diagnosis not present

## 2023-03-07 DIAGNOSIS — Y908 Blood alcohol level of 240 mg/100 ml or more: Secondary | ICD-10-CM | POA: Insufficient documentation

## 2023-03-07 LAB — COMPREHENSIVE METABOLIC PANEL
ALT: 36 U/L (ref 0–44)
AST: 62 U/L — ABNORMAL HIGH (ref 15–41)
Albumin: 4.5 g/dL (ref 3.5–5.0)
Alkaline Phosphatase: 58 U/L (ref 38–126)
Anion gap: 11 (ref 5–15)
BUN: 12 mg/dL (ref 8–23)
CO2: 23 mmol/L (ref 22–32)
Calcium: 9.1 mg/dL (ref 8.9–10.3)
Chloride: 107 mmol/L (ref 98–111)
Creatinine, Ser: 0.66 mg/dL (ref 0.44–1.00)
GFR, Estimated: >60 mL/min (ref >60)
Glucose, Bld: 103 mg/dL — ABNORMAL HIGH (ref 70–99)
Potassium: 4.1 mmol/L (ref 3.5–5.1)
Sodium: 141 mmol/L (ref 135–145)
Total Bilirubin: 0.8 mg/dL (ref 0.0–1.2)
Total Protein: 7.3 g/dL (ref 6.5–8.1)

## 2023-03-07 LAB — CBC
HCT: 42.5 % (ref 36.0–46.0)
Hemoglobin: 14 g/dL (ref 12.0–15.0)
MCH: 30.9 pg (ref 26.0–34.0)
MCHC: 32.9 g/dL (ref 30.0–36.0)
MCV: 93.8 fL (ref 80.0–100.0)
Platelets: 343 10*3/uL (ref 150–400)
RBC: 4.53 MIL/uL (ref 3.87–5.11)
RDW: 12.5 % (ref 11.5–15.5)
WBC: 9.4 10*3/uL (ref 4.0–10.5)
nRBC: 0 % (ref 0.0–0.2)

## 2023-03-07 LAB — ACETAMINOPHEN LEVEL: Acetaminophen (Tylenol), Serum: <10 ug/mL — ABNORMAL LOW (ref 10–30)

## 2023-03-07 LAB — SALICYLATE LEVEL: Salicylate Lvl: <7 mg/dL — ABNORMAL LOW (ref 7.0–30.0)

## 2023-03-07 NOTE — ED Notes (Addendum)
Pt placed on [place] at 2 liters and improved to 95%. MD quale at bedside. Pt not dressed out at this time due to cooperation.

## 2023-03-07 NOTE — ED Triage Notes (Addendum)
Pt to ed from a Bar in [place] via [facility]. Per [employer] pt is an overdose. Pt told another bar person that she took some meds. An on scene nurse had narcan in her pocket and another on scene nurse gave the narcan. Pt then woke up and vomited. Per EMS pt was swinging on them in the unit.  Vitals per EMS>  157/79 71HR  95%  106BGL  Pt states "fuck no" when asked if she took any pills.  In triage pt is hypoxic at 85% on RA and being non-cooperative in triage.

## 2023-03-07 NOTE — ED Provider Notes (Signed)
[facility] Provider Note    Event Date/Time   First MD Initiated Contact with Patient 03/07/23 2314     (approximate)   History   Drug Overdose  EM caveat: Patient somnolent, yells out agitatedly at nursing staff but will not answer direct questioning  HPI  Donna Petersen is a 63 y.o. female   reportedly she was at a bar when she had decreased responsiveness, made statements about suicidality, was concern for possible overdose of substance or alcohol.  She was given Narcan on scene  Patient herself just reports she wants to be left alone, she is very abrasive interactions and speech.  She rolls on her side it wants to fall back to sleep.      Physical Exam   Triage Vital Signs: ED Triage Vitals [03/07/23 2301]  Encounter Vitals Group     BP (!) 176/80     Systolic BP Percentile      Diastolic BP Percentile      Pulse Rate 67     Resp 12     Temp 98 F (36.7 C)     Temp Source Oral     SpO2 (S) (!) 87 %     Weight      Height      Head Circumference      Peak Flow      Pain Score 0     Pain Loc      Pain Education      Exclude from Growth Chart     Most recent vital signs: Vitals:   03/08/23 0558 03/08/23 0600  BP: 115/60 127/63  Pulse: 65 66  Resp: 16 15  Temp:    SpO2: 95% 95%     General: And somnolent but responds to verbal stimuli.  Very abrasive, reaches out agitated way, cursing.  When left alone she turns on her left side and rest comfortably without distress.  Speech is somewhat slurred CV:  Good peripheral perfusion.  Normal tones Resp:  Normal effort.  Clear bilateral with normal rate and effort Abd:  No distention.  Other:  Normocephalic atraumatic.  No obvious evidence of injury.  There is no reported history of trauma  Pupils are equal round reactive.  There is no obvious pinpoint pupils at this time   ED Results / Procedures / Treatments   Labs (all labs ordered are listed, but only abnormal results are  displayed) Labs Reviewed  COMPREHENSIVE METABOLIC PANEL - Abnormal; Notable for the following components:      Result Value   Glucose, Bld 103 (*)    AST 62 (*)    All other components within normal limits  URINE DRUG SCREEN, QUALITATIVE ([facility] ONLY) - Abnormal; Notable for the following components:   Cannabinoid 50 Ng, Ur Fidelity POSITIVE (*)    All other components within normal limits  ACETAMINOPHEN LEVEL - Abnormal; Notable for the following components:   Acetaminophen (Tylenol), Serum <10 (*)    All other components within normal limits  SALICYLATE LEVEL - Abnormal; Notable for the following components:   Salicylate Lvl <7.0 (*)    All other components within normal limits  ETHANOL - Abnormal; Notable for the following components:   Alcohol, Ethyl (B) 305 (*)    All other components within normal limits  CBG MONITORING, ED - Abnormal; Notable for the following components:   Glucose-Capillary 112 (*)    All other components within normal limits  CBC  CBG MONITORING, ED  CBG MONITORING, ED  CBG MONITORING, ED     EKG  Inter by me at 0020 heart rate 65 QRS 110 QTc 500 Normal sinus rhythm no evidence of acute ischemia, mild incomplete right bundle branch block like morphology   RADIOLOGY  CT Head Wo Contrast Result Date: 03/08/2023 CLINICAL DATA:  Altered mental status EXAM: CT HEAD WITHOUT CONTRAST TECHNIQUE: Contiguous axial images were obtained from the base of the skull through the vertex without intravenous contrast. RADIATION DOSE REDUCTION: This exam was performed according to the departmental dose-optimization program which includes automated exposure control, adjustment of the mA and/or kV according to patient size and/or use of iterative reconstruction technique. COMPARISON:  None Available. FINDINGS: Brain: No mass,hemorrhage or extra-axial collection. 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 Otherwise normal appearance of the parenchyma and CSF spaces. Vascular: No hyperdense  vessel or unexpected vascular calcification. Skull: The visualized skull base, calvarium and extracranial soft tissues are normal. Sinuses/Orbits: No fluid levels or advanced mucosal thickening of the visualized paranasal sinuses. No mastoid or middle ear effusion. Normal orbits. Other: None. IMPRESSION: 1. No acute intracranial abnormality. 2. 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 Electronically Signed   By: Deatra Robinson M.D.   On: 03/08/2023 03:39   DG Chest 1 View Result Date: 03/07/2023 CLINICAL DATA:  Loss of consciousness regained after Narcan. Hypoxia. Overdose. EXAM: CHEST  1 VIEW COMPARISON:  07/12/2016 FINDINGS: Normal cardiomediastinal silhouette. Aortic atherosclerotic calcification. No focal consolidation, pleural effusion, or pneumothorax. No displaced rib fractures. IMPRESSION: No active disease. Electronically Signed   By: Minerva Fester M.D.   On: 03/07/2023 23:55      PROCEDURES:  Critical Care performed: No  Procedures   MEDICATIONS ORDERED IN ED: Medications  sodium chloride 0.9 % bolus 500 mL (0 mLs Intravenous Stopped 03/08/23 0622)     IMPRESSION / MDM / ASSESSMENT AND PLAN / ED COURSE  I reviewed the triage vital signs and the nursing notes.                              Differential diagnosis includes, but is not limited to, possible alcohol abuse, substance abuse, intentional overdose with naloxone reversal, suicidal ideation, acute metabolic process or less likely acute central neurologic process which seems unlikely, syncopal episode etc.  The patient's clinical history and history provided as well as her examination I am quite suspicious this is related to alcohol abuse.  She was reportedly made suicidal statements and also went unresponsive and was given naloxone, but is unclear to me if she actually had opioid exposure or not  Will place her under IVC for safety and concerns for suicidal statements  Patient's presentation is most consistent with acute  complicated illness / injury requiring diagnostic workup.   The patient is on the cardiac monitor to evaluate for evidence of arrhythmia and/or significant heart rate changes.   Clinical Course as of 03/08/23 0738  Sat Mar 08, 2023  0038 Ethanol level noted at 305. [MQ]    Clinical Course User Index [MQ] Sharyn Creamer, MD   ----------------------------------------- 12:51 AM on 03/08/2023 ----------------------------------------- Patient resting.  Even unlabored respirations.  No longer on supplemental oxygen saturation 92%.  No evidence of increased work of breathing.  Continue to monitor closely.  At this juncture the ethanol level of 305 is the likely cause I suspect of her agitation and's as well as somnolence, but other concomitant medications are considered.  Initial salicylate and acetaminophen levels  are negative.  Will continue to monitor closely for respiratory depression or other changes at this time  Patient observed throughout the night, she is slowly improving.  She is allowing nursing care, compliant with care request, conversant and verbalizing nursing staff without issue as of 7 AM when I checked in with her nurse.  Currently awaiting psychiatry consultation.  Ongoing plan including pending psychiatry consult and IVC passed on my partner Dr. Lenard Lance  FINAL CLINICAL IMPRESSION(S) / ED DIAGNOSES   Final diagnoses:  Alcohol abuse  Suicidal ideation     Rx / DC Orders   ED Discharge Orders     None        Note:  This document was prepared using Dragon voice recognition software and may include unintentional dictation errors.   Sharyn Creamer, MD 03/08/23 [phone]

## 2023-03-08 ENCOUNTER — Emergency Department
Admission: EM | Admit: 2023-03-08 | Discharge: 2023-03-09 | Disposition: A | Discharge status: Home / Self Care | Payer: 59 | Attending: Emergency Medicine | Admitting: Emergency Medicine

## 2023-03-08 ENCOUNTER — Other Ambulatory Visit: Payer: Self-pay

## 2023-03-08 DIAGNOSIS — T50902A Poisoning by unspecified drugs, medicaments and biological substances, intentional self-harm, initial encounter: Secondary | ICD-10-CM | POA: Diagnosis not present

## 2023-03-08 DIAGNOSIS — F1010 Alcohol abuse, uncomplicated: Secondary | ICD-10-CM | POA: Diagnosis not present

## 2023-03-08 DIAGNOSIS — R45851 Suicidal ideations: Secondary | ICD-10-CM | POA: Diagnosis not present

## 2023-03-08 DIAGNOSIS — F10129 Alcohol abuse with intoxication, unspecified: Secondary | ICD-10-CM | POA: Diagnosis not present

## 2023-03-08 LAB — URINE DRUG SCREEN, QUALITATIVE (ARMC ONLY)
Amphetamines, Ur Screen: NOT DETECTED
Barbiturates, Ur Screen: NOT DETECTED
Benzodiazepine, Ur Scrn: NOT DETECTED
Cannabinoid 50 Ng, Ur ~~LOC~~: POSITIVE — AB
Cocaine Metabolite,Ur ~~LOC~~: NOT DETECTED
MDMA (Ecstasy)Ur Screen: NOT DETECTED
Methadone Scn, Ur: NOT DETECTED
Opiate, Ur Screen: NOT DETECTED
Phencyclidine (PCP) Ur S: NOT DETECTED
Tricyclic, Ur Screen: NOT DETECTED

## 2023-03-08 LAB — CBG MONITORING, ED: Glucose-Capillary: 112 mg/dL — ABNORMAL HIGH (ref 70–99)

## 2023-03-08 LAB — ETHANOL: Alcohol, Ethyl (B): 305 mg/dL (ref <10)

## 2023-03-08 MED ORDER — SODIUM CHLORIDE 0.9 % IV BOLUS
500.0000 mL | Freq: Once | INTRAVENOUS | Status: AC
  Administered 2023-03-08: 500 mL via INTRAVENOUS

## 2023-03-08 NOTE — ED Notes (Signed)
Upon RN entrance to room, pt got off bed, took off her IV, cardiac leads and gown, RN asked pt to return to bed and pt sad on bedside commode. Pt refuses to get back in bed.

## 2023-03-08 NOTE — BH Assessment (Signed)
TTS/Psych attempted to assess patient, patient can be seen walking in her room attempting to use her bathroom while hooked up to the cardiac monitor, patient appears agitated and uncooperative at this time as she told this writer to "fuck off." Patient's BAL is 305 and is not currently medically cleared. Psyc to attempt assessment once patient is medically cleared and patient has sobered up from alcohol intoxication.   Will attempt at a later time when patient is willing to engage in assessment

## 2023-03-08 NOTE — Consult Note (Signed)
[facility] Psychiatric Consult Initial  Patient Name: .Donna Petersen  MRN: [id number]  DOB: Feb 26, 1960  Consult Order details:  Orders (From admission, onward)     Start     Ordered   03/07/23 2317  IP CONSULT TO PSYCHIATRY       Ordering Provider: Sharyn Creamer, MD  Provider:  (Not yet assigned)  Question Answer Comment  Place call to: psych md   Reason for Consult Consult   Diagnosis/Clinical Info for Consult: ivc, suicidal ideation reported , possible overdose      03/07/23 2317   03/07/23 2304  CONSULT TO CALL ACT TEAM       Ordering Provider: Sharyn Creamer, MD  Provider:  (Not yet assigned)  Question:  Reason for Consult?  Answer:  psych   03/07/23 2303             Mode of Visit: Tele-visit Virtual Statement:TELE PSYCHIATRY ATTESTATION & CONSENT As the provider for this telehealth consult, I attest that I verified the patient's identity using two separate identifiers, introduced myself to the patient, provided my credentials, disclosed my location, and performed this encounter via a HIPAA-compliant, real-time, face-to-face, two-way, interactive audio and video platform and with the full consent and agreement of the patient (or guardian as applicable.) Patient physical location: [facility] ED. Telehealth provider physical location: home office in state of [place].   Video start time: 11:30 AM Video end time: 12 PM    Psychiatry Consult Evaluation  Service Date: March 08, 2023 LOS:  LOS: 0 days  Chief Complaint "drug overdose"  Primary Psychiatric Diagnoses  Drug Overdose   Assessment  Donna Petersen is a 63 y.o. female presented to [facility] emergency department under IVC due to drug overdose and suicidal ideations.  Patient reports not recalling how she got to the emergency department.  Patient reports believing that she blacked out after drinking an excess amount of liquor.  Per her record, patient informed another person at the bar that she had taken  medications prior to becoming unconscious and requiring Narcan.  Today patient denies taking an excess amount of medications, denying intentional overdose to call death.  Patient denies suicidal ideation at this time.  Patient reports that her son died in 09-17-2023 which caused her to stop drinking, but she relapsed last night due to thoughts of her son's death.  Patient reports "I drink too much that my body could not handle".  Patient reports that she blacked out.  Patient reports that she first started drinking alcohol at 64 years old, and she started drinking regularly in her early 48s.  Patient reports that she has been able to quit drinking off and on since her early 63s, without need of substance abuse treatment or rehab.  Patient denies a past psychiatric history.  Per her record patient was treated with anxiety in the past.  Patient later acknowledges that she had work related anxiety years ago.  Patient denies illicit substance use, although her UDS is positive for cannabinoid.  Patient denies further psychiatric history or being prescribed psychotropic medications. Patient reports living alone, having access to guns at her residence.  She reports having a close friend who is her social support.  Patient provided permission for this writer to contact her friend but was reluctant.  Attempted to contact patient's friend without success.  Per patient her friend's name is [name] and her phone number is [phone]. Patient declined for this writer to contact her oldest son.  Please  see plan below for detailed recommendations.   Diagnoses:  Active Hospital problems: Active Problems:   Alcohol abuse    Plan   ## Psychiatric Medication Recommendations:  ## Medical Decision Making Capacity:     ## Further Work-up:  -- CBC, BMP, UDS    ## Disposition:--Patient appears to have inconsistencies with her history and appears to answer questions for secondary gain of immediate discharge, despite  safety considerations and recent events leading to ED admission. This Clinical research associate questions patient's denial of intent to self-harm and unable to determine if immediate discharge is safe at this time. Disposition pending continued observation and collateral contact to ensure a safe discharge plan.  ## Behavioral / Environmental: - No specific recommendations at this time.     ## Safety and Observation Level:  - Based on my clinical evaluation, I estimate the patient to be at low risk of self harm in the current setting. - At this time, we recommend  routine. This decision is based on my review of the chart including patient's history and current presentation, interview of the patient, mental status examination, and consideration of suicide risk including evaluating suicidal ideation, plan, intent, suicidal or self-harm behaviors, risk factors, and protective factors. This judgment is based on our ability to directly address suicide risk, implement suicide prevention strategies, and develop a safety plan while the patient is in the clinical setting. Please contact our team if there is a concern that risk level has changed.  CSSR Risk Category:C-SSRS RISK CATEGORY: No Risk  Suicide Risk Assessment: Patient has following modifiable risk factors for suicide: access to guns and recklessness, which we are addressing by continued observation. Patient has following non-modifiable or demographic risk factors for suicide: Recent death of her son Patient has the following protective factors against suicide: Supportive friends  Thank you for this consult request. Recommendations have been communicated to the primary team.  We will continue to observe and complete collateral contact for safe discharge plan.   Mcneil Sober, NP       History of Present Illness  Relevant Aspects of Hospital ED Course:  Admitted on 03/08/2023 for drug overdose and suicidal ideation.   Patient Report:  See above  Psych ROS:   Depression: Denies Anxiety: Denies Mania (lifetime and current): Denies Psychosis: (lifetime and current): Denies  Collateral information:  Contacted Nelson Chimes multiple times at [phone] without success  Review of Systems  Psychiatric/Behavioral:  Positive for substance abuse. Negative for depression, hallucinations, memory loss and suicidal ideas. The patient is not nervous/anxious and does not have insomnia.   All other systems reviewed and are negative.    Psychiatric and Social History  Psychiatric History:  Information collected from patient  Prev Dx/Sx: Anxiety Current Psych Provider: N/A Home Meds (current): NA Previous Med Trials: NA Therapy: N/A  Prior Psych Hospitalization: Denies Prior Self Harm: Denies Prior Violence: Denies  Family Psych History: Denies Family Hx suicide: Denies  Social History:  Developmental Hx: Within normal limits Educational Hx:  Occupational Hx:  Legal Hx: Denies Living Situation: Independent living Spiritual Hx: Christian Access to weapons/lethal means: Yes  Substance History Alcohol: Yes Type of alcohol patient was unable to recall Last Drink last night per patient Number of drinks per day unable to recall per patient History of alcohol withdrawal seizures denies History of DT's denies but drank to blackout per patient Tobacco:  Illicit drugs: UDS positive for cannabinoid Prescription drug abuse: Denies Rehab hx: Denies  Exam Findings  Physical Exam: No  abnormalities observed Vital Signs:  Temp:  [98 F (36.7 C)-98.4 F (36.9 C)] 98.4 F (36.9 C) (01/18 1048) Pulse Rate:  [64-79] 65 (01/18 0930) Resp:  [11-21] 15 (01/18 0930) BP: (96-176)/(56-88) 156/72 (01/18 0930) SpO2:  [87 %-100 %] 100 % (01/18 0930) Blood pressure (!) 156/72, pulse 65, temperature 98.4 F (36.9 C), temperature source Oral, resp. rate 15, SpO2 100%. There is no height or weight on file to calculate BMI.  Physical Exam Vitals and  nursing note reviewed.  Constitutional:      Appearance: Normal appearance.  Neurological:     General: No focal deficit present.     Mental Status: She is alert and oriented to person, place, and time.  Psychiatric:        Mood and Affect: Mood normal.        Behavior: Behavior normal.        Thought Content: Thought content normal.        Judgment: Judgment normal.     Mental Status Exam: General Appearance: Casual  Orientation: Full  Memory: Good  Concentration: Good  Recall: Fair  Attention good  Eye Contact: Good  Speech: Good clear and coherent  Language: Good  Volume: Normal  Mood: Okay  Affect: Congruent  Thought Process: Goal directed  Thought Content:  Logical  Suicidal Thoughts:   Currently denies  Homicidal Thoughts:  No  Judgement:  Poor  Insight:  Lacking  Psychomotor Activity: Normal  Akathisia: No  Fund of Knowledge: Good      Assets:  Communication Skills Housing  Cognition:  WNL  ADL's:  Intact  AIMS (if indicated):       Other History   These have been pulled in through the EMR, reviewed, and updated if appropriate.  Family History:  The patient's family history is not on file.  Medical History: Past Medical History:  Diagnosis Date   Hypertension    Thyroid disease     Surgical History: Past Surgical History:  Procedure Laterality Date   CHOLECYSTECTOMY       Medications:  No current facility-administered medications for this encounter.  Current Outpatient Medications:    levothyroxine (SYNTHROID) 125 MCG tablet, Take 125 mcg by mouth daily before breakfast., Disp: , Rfl:    lisinopril (PRINIVIL,ZESTRIL) 10 MG tablet, Take 10 mg by mouth daily., Disp: , Rfl:    naproxen (NAPROSYN) 500 MG tablet, Take 1 tablet (500 mg total) by mouth 2 (two) times daily with a meal., Disp: 30 tablet, Rfl: 0   sertraline (ZOLOFT) 50 MG tablet, Take 50 mg by mouth daily., Disp: , Rfl:   Allergies: Allergies  Allergen Reactions   Latex  Anaphylaxis    Mcneil Sober, NP

## 2023-03-08 NOTE — ED Provider Notes (Signed)
-----------------------------------------   8:59 AM on 03/08/2023 ----------------------------------------- I have seen and evaluated the patient.  She is awake alert she is calm cooperative.  I informed the patient of the plan to have psychiatry evaluated as she is currently under an IVC.  Patient is agreeable to this plan.  Patient states she had not drinking liquor in many months and she believes that is likely what led to her significant intoxication and statements.  Patient is medically cleared at this time awaiting psychiatric disposition.   Minna Antis, MD 03/08/23 0900

## 2023-03-08 NOTE — ED Notes (Signed)
Hospital meal provided, pt tolerated w/o complaints.  Waste discarded appropriately.  

## 2023-03-08 NOTE — ED Notes (Signed)
Pt back in bed, on cardiac monitor

## 2023-03-08 NOTE — ED Notes (Signed)
pt recieved snack and drink 

## 2023-03-08 NOTE — BH Assessment (Signed)
This Clinical research associate attempted to assess patient, patient can be seen walking in her room attempting to use her bathroom while hooked up to the cardiac monitor, patient appears agitated and uncooperative at this time as she told this writer to "fuck off." Patient's BAL is 305 and is not currently medically cleared. Psyc to attempt assessment once patient is medically cleared and patient has sobered up from alcohol intoxication.

## 2023-03-08 NOTE — ED Notes (Signed)
Assumed care of patient. Patient is resting in bed.

## 2023-03-08 NOTE — BH Assessment (Addendum)
Comprehensive Clinical Assessment (CCA) Note  03/08/2023 Donna Petersen [id number]  Chief Complaint:  Chief Complaint  Patient presents with   Drug Overdose   Visit Diagnosis:    F32.2 Major depressive disorder, Single episode, Severe F10.20 Alcohol use disorder, Severe  Flowsheet Row ED from 03/08/2023 in [facility] Emergency Department at [facility] ED from 07/10/2021 in [facility] Urgent Care at [facility]  ED from 05/14/2020 in [facility] at [facility]   C-SSRS RISK CATEGORY No Risk No Risk No Risk      The patient demonstrates the following risk factors for suicide: Chronic risk factors for suicide include: psychiatric disorder of NA and substance use disorder. Acute risk factors for suicide include: social withdrawal/isolation. Protective factors for this patient include: positive social support, positive therapeutic relationship, coping skills, and hope for the future. Considering these factors, the overall suicide risk at this point appears to be no risk.    Pending Disposition: C. Penn NP, Recommends pt to be psychiatric cleared once collateral information obtain.  Disposition discussed with Donna Petersen.  RN to discuss with EDP.   Donna Petersen is a 63 year old female who presents voluntarily to [facility] and later IVC'd while in ED., and unaccompanied.  Pt IVC reads" suicidal, reported overdose reversed with naloxone".  Pt denies SI, HI, or AVH.  Pt reports "I relapsed, I had seven months of clean time from alcohol".  Pt acknowledged the following symptoms, sadness, crying, isolation, frustrated, irritable, "I am angry at my sister, she wants me to come back to [place], and she is trying to sabotage and make me lose my job".  Pt denies prior suicide attempts.  Pt says she has been drinking alcohol (Tequila) daily, "I lost my son in July 2024 due to motorcycle accident, I started thinking about him".  Pt denies using any other substance.  Ethanol is 305; also, UDS is positive for  cannabis.  Pt reports that she started back working full time, "I am grateful to be back working".  Pt reports she lives alone. Pt reports one support person, friend Donna Petersen.  Pt reports "my father was an alcoholic; also, I have family history of mental illness.  Pt denies any history of abuse or trauma.  Pt denies any current legal problems.  Pt reports 2, (57's) guns in the house; also, reports a rifle in the home.  Pt says she is currently not receiving weekly outpatient therapy; also, is not receiving outpatient medication management.  Pt is dressed in scrubs, alert, oriented x 5 with normal speech and restless motor behavior.  Eye contact is good and Patient is tearful.  Pt's mood is depressed and affect is anxious.  Thought process is relevant.  Pt's insight is good and judgment is fair.  There is no indication Pt is currently responding to internal l stimuli or experiencing delusional thought content.  CCA Screening, Triage and Referral (STR)  Patient Reported Information [facility] [place]  What Is the Reason for Your Visit/Call Today? Drug overdose, Alcohol Addicition  How Long Has This Been Causing You Problems? 1-6 months  What Do You Feel Would Help You the Most Today? Alcohol or Drug Use Treatment; Treatment for Depression or other mood problem   Have You Recently Had Any Thoughts About Hurting Yourself? No (IVC'd reads "Suicidal, reported overdose reversed with naloxone)  Are You Planning to Commit Suicide/Harm Yourself At This time? No   Flowsheet Row ED from 03/08/2023 in  [facility] at [facility] ED from 07/10/2021 in [facility]gent Care at [facility]  ED from 05/14/2020 in [facility]gent Care at [facility]   C-SSRS RISK CATEGORY No Risk No Risk No Risk       Have you Recently Had Thoughts About Hurting Someone Karolee Ohs? No  Are You Planning to Harm Someone at This Time? No  Explanation: No   Have  You Used Any Alcohol or Drugs in the Past 24 Hours? Yes  How Long Ago Did You Use Drugs or Alcohol? 12 hours  What Did You Use and How Much? Alcohol, Tequila (I drank a lot, I don't remember)   Do You Currently Have a Therapist/Psychiatrist? No  Name of Therapist/Psychiatrist:    Have You Been Recently Discharged From Any Office Practice or Programs? No  Explanation of Discharge From Practice/Program: No   CCA Screening Triage Referral Assessment Type of Contact: Face-to-Face  Telemedicine Service Delivery:   Is this Initial or Reassessment?   Date Telepsych consult ordered in [facility]:    Time Telepsych consult ordered in [facility]:    Location of Assessment: [facility] ED  Provider Location: [facility]   Collateral Involvement: NO collateral involved   Does Patient Have a Automotive engineer Guardian? No  Legal Guardian Contact Information: n/a  Copy of Legal Guardianship Form: -- (n/a)  Legal Guardian Notified of Arrival: -- (n/a)  Legal Guardian Notified of Pending Discharge: -- (n/a)  If Minor and Not Living with Parent(s), Who has Custody? n/a  Is CPS involved or ever been involved? Never  Is APS involved or ever been involved? Never   Patient Determined To Be At Risk for Harm To Self or Others Based on Review of Patient Reported Information or Presenting Complaint? Yes, for Self-Harm  Method: Plan without intent  Availability of Means: No access or NA  Intent: Vague intent or NA  Notification Required: No need or identified person  Additional Information for Danger to Others Potential: -- (N/A)  Additional Comments for Danger to Others Potential: N/A  Are There Guns or Other Weapons in Your Home? Yes  Types of Guns/Weapons: Pt reports 2, 57's, and 1, 30-30 rifel  Are These Weapons Safely Secured?                            No  Who Could Verify You Are Able To Have These Secured: Pt reports that they are kept in the house  Do You Have any  Outstanding Charges, Pending Court Dates, Parole/Probation? No  Contacted To Inform of Risk of Harm To Self or Others: Law Enforcement    Does Patient Present under Involuntary Commitment? Yes    [place] of Residence: [place]   Patient Currently Receiving the Following Services: Not Receiving Services   Determination of Need: Urgent (48 hours)   Options For Referral: No data recorded    CCA Biopsychosocial Patient Reported Schizophrenia/Schizoaffective Diagnosis in Past: No   Strengths: Pt communicating and answering questions.   Mental Health Symptoms Depression:  Difficulty Concentrating; Tearfulness; Sleep (too much or little); Fatigue; Irritability   Duration of Depressive symptoms: Duration of Depressive Symptoms: Greater than two weeks   Mania:  None   Anxiety:   Restlessness; Sleep; Tension; Worrying   Psychosis:  None   Duration of Psychotic symptoms:    Trauma:  Guilt/shame; Re-experience of traumatic event (Pt reports she is still grieveing the death of son's due  to motorcycle.)   Obsessions:  Attempts to suppress/neutralize; Disrupts routine/functioning   Compulsions:  Disrupts with routine/functioning; "Driven" to perform behaviors/acts   Inattention:  None   Hyperactivity/Impulsivity:  None   Oppositional/Defiant Behaviors:  None   Emotional Irregularity:  Chronic feelings of emptiness; Potentially harmful impulsivity   Other Mood/Personality Symptoms:  Depression/Isolation    Mental Status Exam Appearance and self-care  Stature:  Average   Weight:  Average weight   Clothing:  -- (dressed in scrubs)   Grooming:  Normal   Cosmetic use:  -- (Pt dressed in scrubs.)   Posture/gait:  Normal   Motor activity:  Slowed   Sensorium  Attention:  Normal   Concentration:  Normal   Orientation:  X5   Recall/memory:  Normal   Affect and Mood  Affect:  Anxious   Mood:  Depressed   Relating  Eye contact:  Normal   Facial  expression:  Sad; Responsive; Depressed   Attitude toward examiner:  Cooperative   Thought and Language  Speech flow: Clear and Coherent   Thought content:  Appropriate to Mood and Circumstances   Preoccupation:  None   Hallucinations:  None   Organization:  Coherent   [employer] of Knowledge:  Good   Intelligence:  Average   Abstraction:  Normal   Judgement:  Poor   Reality Testing:  Realistic   Insight:  Good   Decision Making:  Impulsive   Social Functioning  Social Maturity:  Isolates   Social Judgement:  Heedless   Stress  Stressors:  Grief/losses; Other (Comment)   Coping Ability:  Overwhelmed; Deficient supports   Skill Deficits:  Decision making; Self-control   Supports:  Support needed     Religion: Religion/Spirituality Are You A Religious Person?: Yes What is Your Religious Affiliation?: Christian How Might This Affect Treatment?: 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   CCA Employment/Education Employment/Work Situation: Employment / Work Clinical biochemist has Been Impacted by Current Illness: Yes Describe how Patient's Job has Been Impacted: None Has Patient ever Been in the [employer]?: No  Education: Education Is Patient Currently Attending School?: No Last Grade Completed: 12 Did You Attend College?: Yes What Type of College Degree Do you Have?: Pt did not provide that information Did You Have An Individualized  Education Program (IIEP): No Did You Have Any Difficulty At School?: No Patient's Education Has Been Impacted by Current Illness: No   CCA Family/Childhood History Family and Relationship History: Family history Marital status: Single Does patient have children?: Yes How many children?: 1 (I loss my son in July 2024, due to motocyce accident) How is patient's relationship with their children?: close  Childhood History:  Childhood History By whom was/is the patient raised?: Both parents Did patient suffer any verbal/emotional/physical/sexual abuse as a child?: No Did patient suffer from severe childhood neglect?: No Has patient ever been sexually abused/assaulted/raped as an adolescent or adult?: No Was the patient ever a victim of a crime or a  disaster?: No Witnessed domestic violence?: No Has patient been affected by domestic violence as an adult?: No       CCA Substance Use Alcohol/Drug Use: Alcohol / Drug Use Pain Medications: See MRA Prescriptions: See MRA Over the Counter: See MRA History of alcohol / drug use?: Yes Longest period of sobriety (when/how long): Pt reprots seven months of sobreity, "I relapse yesterday".  Pt reports "I started thinking about my son who passed away in 08-22-2024due to motorcycle accident". Negative Consequences of Use: Personal relationships Withdrawal Symptoms: Agitation Substance #1 Name of Substance 1: Alcohol 1 - Age of First Use: 21 1 - Amount (size/oz): Tequila 1 - Frequency: daily 1 - Duration: ongoing 1 - Last Use / Amount: 13 hours ago 1 - Method of Aquiring: Purchase 1- Route of Use: drinking                       ASAM's:  Six Dimensions of Multidimensional Assessment  Dimension 1:  Acute Intoxication and/or Withdrawal Potential:   Dimension 1:  Description of individual's past and current experiences of substance use and withdrawal: Pt reprots seven months of sobreity, "I relapse yesterday". Pt reports "I  started thinking about my son who passed away in 08/22/2024due to motorcycle accident".  Dimension 2:  Biomedical Conditions and Complications:   Dimension 2:  Description of patient's biomedical conditions and  complications: Pt did not reports biomedical complications  Dimension 3:  Emotional, Behavioral, or Cognitive Conditions and Complications:  Dimension 3:  Description of emotional, behavioral, or cognitive conditions and complications: anxious  Dimension 4:  Readiness to Change:  Dimension 4:  Description of Readiness to Change criteria: contemplation  Dimension 5:  Relapse, Continued use, or Continued Problem Potential:  Dimension 5:  Relapse, continued use, or continued problem potential critiera description: Relapse  Dimension 6:  Recovery/Living Environment:  Dimension 6:  Recovery/Iiving environment criteria description: Pt reports she lives along in a safe enviorment.  ASAM Severity Score: ASAM's Severity Rating Score: 11  ASAM Recommended Level of Treatment:     Substance use Disorder (SUD) Substance Use Disorder (SUD)  Checklist Symptoms of Substance Use: Continued use despite having a persistent/recurrent physical/psychological problem caused/exacerbated by use, Continued use despite persistent or recurrent social, interpersonal problems, caused or exacerbated by use, Evidence of tolerance, Presence of craving or strong urge to use, Recurrent use that results in a failure to fulfill major role obligations (work, school, home), Social, occupational, recreational activities given up or reduced due to use  Recommendations for Services/Supports/Treatments: Recommendations for Services/Supports/Treatments Recommendations For Services/Supports/Treatments: Inpatient Hospitalization  Disposition Recommendation per psychiatric provider: We recommend inpatient psychiatric hospitalization after medical hospitalization. Patient has been involuntarily committed on 11:30a.    DSM5  Diagnoses: There are no active problems to display for this patient.    Referrals to Alternative Service(s): Referred to Alternative Service(s):   Place:   Date:   Time:    Referred to Alternative Service(s):   Place:   Date:   Time:    Referred to Alternative Service(s):   Place:   Date:   Time:    Referred to Alternative Service(s):   Place:   Date:   Time:     Meryle Ready, [facility]

## 2023-03-08 NOTE — ED Notes (Addendum)
Patient belongings were inventoried infront of the patient, do to the patient being uncooperative when she first arrived to the ED. Myself and Myra, NT inventoried the patients belongings.

## 2023-03-08 NOTE — ED Notes (Signed)
Patient is awake and was assisted to the bathroom. Patient states she does not remember what happened last night. Also states she had not been out in a long time.

## 2023-03-08 NOTE — ED Notes (Signed)
Patient was walked over to [place] with myself, NT and security,

## 2023-03-09 DIAGNOSIS — F1010 Alcohol abuse, uncomplicated: Secondary | ICD-10-CM | POA: Diagnosis not present

## 2023-03-09 LAB — CBG MONITORING, ED
Glucose-Capillary: 118 mg/dL — ABNORMAL HIGH (ref 70–99)
Glucose-Capillary: 112 mg/dL — ABNORMAL HIGH (ref 70–99)

## 2023-03-09 NOTE — ED Notes (Signed)
Cousin of pt called, [phone]. Ellwood Sayers. Informed her cannot give information on pt without her permission. Phone call was inappropriately transferred to [facility] RN phone directly by ED secretary.

## 2023-03-09 NOTE — ED Notes (Signed)
Pt sitting in dayroom. Pt is calm, in no acute distress.

## 2023-03-09 NOTE — ED Provider Notes (Signed)
Emergency Medicine Observation Re-evaluation Note  Donna Petersen is a 64 y.o. female, seen on rounds today.  Pt initially presented to the ED for complaints of Drug Overdose  Currently, the patient is is no acute distress. Denies any concerns at this time.  Physical Exam  Blood pressure (!) 153/68, pulse 60, temperature 98.5 F (36.9 C), temperature source Oral, resp. rate 19, SpO2 98%.  Physical Exam: General: No apparent distress Pulm: Normal WOB Neuro: Moving all extremities Psych: Resting comfortably     ED Course / MDM   Clinical Course as of 03/09/23 2031  Sat Mar 08, 2023  0038 Ethanol level noted at 305. [MQ]    Clinical Course User Index [MQ] Sharyn Creamer, MD    I have reviewed the labs performed to date as well as medications administered while in observation.  Recent changes in the last 24 hours include: No acute events overnight.  Plan   Current plan: Evaluated by psychiatry who recommended reversing IVC.  No longer meets criteria.  Denies suicidal ideation. Patient is not under full IVC at this time.    Corena Herter, MD 03/09/23 2031

## 2023-03-09 NOTE — ED Notes (Signed)
Patient asked this nurse when she would D/C. Patient educated on policies r/t IVC and that IVC may be rescinded pending psych re-evaluation. Patient stated, "I don't care, I will be leaving today". This RN reiterated that patient will be updated on status when changes are made.

## 2023-03-09 NOTE — ED Notes (Signed)
Pt provided with lunch tray.

## 2023-03-09 NOTE — ED Provider Notes (Signed)
Emergency Medicine Observation Re-evaluation Note  Donna Petersen is a 63 y.o. female, seen on rounds today.  Pt initially presented to the ED for complaints of Drug Overdose    Physical Exam  BP (!) 158/76   Pulse 62   Temp 98.6 F (37 C) (Oral)   Resp 18   SpO2 95%  Physical Exam General: nad  ED Course / MDM  EKG:   I have reviewed the labs performed to date as well as medications administered while in observation.    Plan  Current plan is for psych/toc.    Willy Eddy, MD 03/09/23 0730

## 2023-03-09 NOTE — ED Notes (Signed)
This tech obtained vital signs on pt.  

## 2023-03-09 NOTE — ED Notes (Signed)
Pt declined snack.  

## 2023-03-09 NOTE — ED Notes (Signed)
Patient given phone to call for a ride.

## 2023-03-09 NOTE — Consult Note (Signed)
[facility] [place] [facility] Consult Follow-up  Patient Name: .Donna Petersen  MRN: [id number]  DOB: [birth year]  Consult Order details:  Orders (From admission, onward)     Start     Ordered   03/07/23 2317  IP CONSULT TO PSYCHIATRY       Ordering Provider: Sharyn Creamer, MD  Provider:  (Not yet assigned)  Question Answer Comment  Place call to: psych md   Reason for Consult Consult   Diagnosis/Clinical Info for Consult: ivc, suicidal ideation reported , possible overdose      03/07/23 2317   03/07/23 2304  CONSULT TO CALL ACT TEAM       Ordering Provider: Sharyn Creamer, MD  Provider:  (Not yet assigned)  Question:  Reason for Consult?  Answer:  psych   03/07/23 2303             Mode of Visit: Tele-visit Virtual Statement:TELE PSYCHIATRY ATTESTATION & CONSENT As the provider for this telehealth consult, I attest that I verified the patient's identity using two separate identifiers, introduced myself to the patient, provided my credentials, disclosed my location, and performed this encounter via a HIPAA-compliant, real-time, face-to-face, two-way, interactive audio and video platform and with the full consent and agreement of the patient (or guardian as applicable.) Patient physical location: [facility] ED. Telehealth provider physical location: home office in state of [place].   Video start time:   Video end time:      Psychiatry Consult Evaluation  Service Date: March 09, 2023 LOS:  LOS: 0 days  Chief Complaint "overdose"  Primary Psychiatric Diagnoses  Overdose with suicidal ideation   Assessment  Donna Petersen is a 63 y.o. female presented to [facility] emergency department due to reported overdose and suicidal ideation.  Patient initially presented agitated and aggressive.  Her ethyl alcohol level was 305 upon presentation.  Her UDS is positive for cannabinoid, although patient denies marijuana use of any kind since her teen years or early 40s.  Although patient's  friend, during the collateral contact today, reports patient does have a recent history of smoking marijuana. Patient also previously denied a psychiatric history, although later admitted to having a history of anxiety.  Today she admits to previously being prescribed Prozac and sertraline, which she was medication compliant for approximately 2 to 3 years before stopping the medication herself due to not liking the way the medication made her feel.  Patient reports that she does not want to take medications because they are not meant to heal. Patient appears to be more engaged today to be a more reliable historian. Patient recalls driving to the bar where she was found unresponsive.  Per patient, she drove to the bar from home and reports having 2 alcoholic beverages, "a beer and a shot".  Per patient's previous days' report, she reports that this visit to the bar was her first time drinking since her son's death in September 19, 2022.  Patient's friend reports that patient has consumed alcoholic beverages on multiple occasions prior to this incident at the bar and she visited two bars the night of the incident, visiting [employer] prior to visiting [employer].  Today patient is more forthcoming and acknowledges that she has consumed alcoholic beverages prior to this most recent visit to the bar.  Patient's friend also acknowledges that patient does have a recent history of smoking marijuana. Patient's friend reports no concern with patient's safety in the past or current. She reports that patient has a full life  and has no known history of suicidal behavior. Patient's friend, Donna Petersen, does report that she previously worked at the bar where patient was found unresponsive and admitted to receiving the story from the bartender. Patient's friend reports that she believes that false information was provided regarding patient taking pills prior to going unresponsive. Narcan was administer after patient went  unresponsive, but UDS was negative for opiates.  Patient initially presented intoxicated as evidence by her ethyl alcohol level of 305. She appears to have metabolized this alcohol content. There has been no behavioral disturbances since the metabolization of alchol. Patient has remained calm during further observation to ensure a safe discharge. Patient consistently denies intentional drug overdose to end her life and denies remembering making statements that she wants to meet Jesus. Patient reports being a [place] and displaying hopefulness. Patient states I have to go to work tomorrow and get home to feed my cats.   She denies SI/HI/AVH/delusional thoughts or paranoia.   Please see plan below for detailed recommendations.   Diagnoses:  Active Hospital problems: Active Problems:   Alcohol abuse    Plan   ## Psychiatric Medication Recommendations:  Patient declined a need for medication management.  ## Medical Decision Making Capacity: Not specifically addressed in this encounter  ## Further Work-up:    ## Disposition:-- There are no psychiatric contraindications to discharge at this time  ## Behavioral / Environmental: - No specific recommendations at this time.     ## Safety and Observation Level:  - Based on my clinical evaluation, I estimate the patient to be at no risk of self harm in the current setting. - At this time, we recommend  routine. This decision is based on my review of the chart including patient's history and current presentation, interview of the patient, mental status examination, and consideration of suicide risk including evaluating suicidal ideation, plan, intent, suicidal or self-harm behaviors, risk factors, and protective factors. This judgment is based on our ability to directly address suicide risk, implement suicide prevention strategies, and develop a safety plan while the patient is in the clinical setting. Please contact our team if there is a  concern that risk level has changed.  CSSR Risk Category:C-SSRS RISK CATEGORY: No Risk  Suicide Risk Assessment:  Patient has the following protective factors against suicide: Supportive friends, Cultural, spiritual, or religious beliefs that discourage suicide, and Pets in the home  Thank you for this consult request. Recommendations have been communicated to the primary team.  We will recommend to psychiatrically clear patient at this time.   Mcneil Sober, NP       History of Present Illness  Relevant Aspects of Hospital ED Course:  Admitted on 03/08/2023 for "overdose".   Patient Report:  See above  Psych ROS:  Depression: Denies Anxiety: Denies Mania (lifetime and current): Denies Psychosis: (lifetime and current): Denies  Collateral information:  Contacted Donna Petersen 1 hr duration  Review of Systems  Psychiatric/Behavioral:  Positive for substance abuse (uds + marijuana).   All other systems reviewed and are negative.    Psychiatric and Social History  Psychiatric History:  Information collected from patient, staff and Nelson Chimes, her friend  Prev Dx/Sx: Anxiety and depression Current Psych Provider: Denies Home Meds (current): Denies Previous Med Trials: Prozac and sertraline Therapy: Denies  Prior Psych Hospitalization: Denies Prior Self Harm: Denies Prior Violence: Denies  Family Psych History: Denies Family Hx suicide: Denies  Social History:  Developmental Hx: Normal Educational Hx:  Occupational Hx:  Research Legal Hx: Denies Living Situation: Lives alone Spiritual Hx: Christian   Substance History Alcohol: Yes Type of alcohol liquor and beer Last Drink 2 days ago Number of drinks per day 6 History of alcohol withdrawal seizures denies History of DT's blackout Tobacco: Denies Illicit drugs: UDS positive for cannabinoid Prescription drug abuse: Denies Rehab hx: Denies  Exam Findings  Physical Exam: No abnormalities noted Vital  Signs:  Temp:  [97.9 F (36.6 C)-98.6 F (37 C)] 98.5 F (36.9 C) (01/19 1940) Pulse Rate:  [60-74] 60 (01/19 1940) Resp:  [18-19] 19 (01/19 1940) BP: (153-164)/(68-82) 153/68 (01/19 1940) SpO2:  [95 %-100 %] 98 % (01/19 1940) Blood pressure (!) 153/68, pulse 60, temperature 98.5 F (36.9 C), temperature source Oral, resp. rate 19, SpO2 98%. There is no height or weight on file to calculate BMI.  Physical Exam Vitals and nursing note reviewed.  Constitutional:      Appearance: Normal appearance.  Neurological:     General: No focal deficit present.     Mental Status: She is alert and oriented to person, place, and time.  Psychiatric:        Mood and Affect: Mood normal.        Behavior: Behavior normal.        Thought Content: Thought content normal.     Mental Status Exam: General Appearance: Fairly groomed  Orientation:  Full (Time, Place, and Person)  Memory:  Immediate;   Good Recent;   Fair Remote;   Good  Concentration:  Concentration: Good and Attention Span: Good  Recall:  Fair  Attention  Good  Eye Contact: Good  Speech: Clear and coherent  Language: Good  Volume: Normal  Mood: Okay  Affect: Congruent  Thought Process:  Goal Directed  Thought Content:  Logical  Suicidal Thoughts:  No  Homicidal Thoughts: No  Judgement: Good  Insight: Good  Psychomotor Activity: Normal  Akathisia: No  Fund of Knowledge: Good      Assets:  Communication Skills Desire for Improvement Housing Social Support  Cognition:  WNL  ADL's:  Intact  AIMS (if indicated):        Other History   These have been pulled in through the EMR, reviewed, and updated if appropriate.  Family History:  The patient's family history is not on file.  Medical History: Past Medical History:  Diagnosis Date   Hypertension    Thyroid disease     Surgical History: Past Surgical History:  Procedure Laterality Date   CHOLECYSTECTOMY       Medications:  No current  facility-administered medications for this encounter.  Current Outpatient Medications:    levothyroxine (SYNTHROID) 125 MCG tablet, Take 125 mcg by mouth daily before breakfast., Disp: , Rfl:    naproxen (NAPROSYN) 500 MG tablet, Take 1 tablet (500 mg total) by mouth 2 (two) times daily with a meal., Disp: 30 tablet, Rfl: 0   lisinopril (PRINIVIL,ZESTRIL) 10 MG tablet, Take 10 mg by mouth daily. (Patient not taking: Reported on 03/09/2023), Disp: , Rfl:    sertraline (ZOLOFT) 50 MG tablet, Take 50 mg by mouth daily. (Patient not taking: Reported on 03/09/2023), Disp: , Rfl:   Allergies: Allergies  Allergen Reactions   Latex Anaphylaxis    Mcneil Sober, NP

## 2023-03-09 NOTE — ED Notes (Signed)
Patient meal tray and juice provided.

## 2023-12-02 ENCOUNTER — Encounter: Payer: Self-pay | Admitting: *Deleted

## 2023-12-02 ENCOUNTER — Ambulatory Visit
Admission: EM | Admit: 2023-12-02 | Discharge: 2023-12-02 | Disposition: A | Discharge status: Home / Self Care | Payer: Self-pay | Attending: Family Medicine | Admitting: Family Medicine

## 2023-12-02 DIAGNOSIS — R202 Paresthesia of skin: Secondary | ICD-10-CM

## 2023-12-02 DIAGNOSIS — R519 Headache, unspecified: Secondary | ICD-10-CM

## 2023-12-02 DIAGNOSIS — H538 Other visual disturbances: Secondary | ICD-10-CM

## 2023-12-02 DIAGNOSIS — I10 Essential (primary) hypertension: Secondary | ICD-10-CM

## 2023-12-02 NOTE — ED Provider Notes (Signed)
 [facility]    CSN: [id number] Arrival date & time: 12/02/23  9071      History   Chief Complaint Chief Complaint  Patient presents with   Nausea   Numbness   Eye Problem    HPI Donna Petersen is a 63 y.o. female.   HPI   Donna Petersen presents for :  Tingling in both hands that started  coming up her arms. Had severe bloating after coffee and my vision is whacked.  Symptoms this morning. Has had intermittent headache for the past few days.  She has history of migraines but improved greatly after menopause.  Endorses fatigue started after breakfast. Feels like I crashed an burned.    She doesn't  take her blood pressure medication.  Takes her thyroid medication.   She wants to be tested for heavy metals after an incident in [place].  I'm involved in an investigation for attempted murder.      Past Medical History:  Diagnosis Date   Hypertension    Thyroid disease     Patient Active Problem List   Diagnosis Date Noted   Alcohol abuse 03/08/2023    Past Surgical History:  Procedure Laterality Date   CHOLECYSTECTOMY      OB History   No obstetric history on file.      Home Medications    Prior to Admission medications   Medication Sig Start Date End Date Taking? Authorizing Provider  levothyroxine (SYNTHROID) 125 MCG tablet Take 125 mcg by mouth daily before breakfast.   Yes [provider]  lisinopril (PRINIVIL,ZESTRIL) 10 MG tablet Take 10 mg by mouth daily. Patient not taking: Reported on 03/09/2023    [provider]  naproxen  (NAPROSYN ) 500 MG tablet Take 1 tablet (500 mg total) by mouth 2 (two) times daily with a meal. 07/12/16   Arloa Ozell BIRCH, PA-C  sertraline (ZOLOFT) 50 MG tablet Take 50 mg by mouth daily. Patient not taking: Reported on 03/09/2023    [provider]  albuterol  (PROVENTIL  HFA;VENTOLIN  HFA) 108 (90 Base) MCG/ACT inhaler Inhale 1-2 puffs into the lungs every 4 (four) hours as needed for  wheezing or shortness of breath. 06/23/16 05/14/20  Van Knee, MD  fluticasone  (FLONASE ) 50 MCG/ACT nasal spray Place 2 sprays into both nostrils daily. 12/12/16 05/14/20  Lacinda Elsie SQUIBB, PA-C    Family History History reviewed. No pertinent family history.  Social History Social History   Tobacco Use   Smoking status: Never   Smokeless tobacco: Never  Vaping Use   Vaping status: Never Used  Substance Use Topics   Alcohol use: Yes   Drug use: No     Allergies   Latex   Review of Systems Review of Systems: negative unless otherwise stated in HPI.      Physical Exam Triage Vital Signs ED Triage Vitals  Encounter Vitals Group     BP 12/02/23 0943 (!) 179/82     Girls Systolic BP Percentile --      Girls Diastolic BP Percentile --      Boys Systolic BP Percentile --      Boys Diastolic BP Percentile --      Pulse Rate 12/02/23 0943 67     Resp 12/02/23 0943 18     Temp 12/02/23 0943 97.8 F (36.6 C)     Temp Source 12/02/23 0943 Oral     SpO2 12/02/23 0943 100 %     Weight --      Height --  Head Circumference --      Peak Flow --      Pain Score 12/02/23 0942 0     Pain Loc --      Pain Education --      Exclude from Growth Chart --    No data found.  Updated Vital Signs BP (!) 179/82 (BP Location: Left Arm)   Pulse 67   Temp 97.8 F (36.6 C) (Oral)   Resp 18   SpO2 100%   Visual Acuity Right Eye Distance:   Left Eye Distance:   Bilateral Distance:    Right Eye Near:   Left Eye Near:    Bilateral Near:     Physical Exam GEN:     alert, cooperative older female and no distress    HENT:  mucus membranes moist, oropharyngeal without lesions or erythema,  nares patent, no nasal discharge EYES:   pupils equal and reactive, EOM intact, no scleral injection or discharge  NECK:  supple, normal ROM, no lymphadenopathy, no goiter RESP:  clear to auscultation bilaterally, no increased work of breathing  CVS:   regular rate and rhythm, no  murmur, distal pulses intact   EXT:   normal ROM, atraumatic, no edema  NEURO:  normal without focal findings,  speech normal, alert and oriented   Skin:   warm and dry, no rash, normal skin turgor Psych: Normal affect, appropriate speech and behavior      UC Treatments / Results  Labs (all labs ordered are listed, but only abnormal results are displayed) Labs Reviewed - No data to display  EKG  If EKG performed, see my interpretation in the MDM section  Radiology No results found.   Procedures Procedures (including critical care time)  Medications Ordered in UC Medications - No data to display  Initial Impression / Assessment and Plan / UC Course  I have reviewed the triage vital signs and the nursing notes.  Pertinent labs & imaging results that were available during my care of the patient were reviewed by me and considered in my medical decision making (see chart for details).       Patient is a 63 y.o. female  who has history of HTN, migraines, hypothyroidism and anxiety. presents for headache, nausea and blurry vision with abdominal bloating requesting heavy metal testing for a attempted murder investigation.  Overall patient is nontoxic-appearing and afebrile.  She is hypertensive here at 179/82. Admits to not taking her blood pressure medications.  She is prescribed lisinopril.   Recommended ED evaluation for labs and possible head CT given her history and symptoms.  She agrees with this. She will go to [facility] ED via private vehicle. Urgent care workup truncated as pt needs a higher level of care.    Discussed MDM, treatment plan and plan for follow-up with patient who agrees with plan.      Final Clinical Impressions(s) / UC Diagnoses   Final diagnoses:  Acute nonintractable headache, unspecified headache type  Tingling of upper extremity  Blurry vision, bilateral  Elevated blood pressure reading in office with diagnosis of hypertension      Discharge Instructions      You have been advised to follow up immediately in the emergency department for concerning signs or symptoms as discussed during your visit. If you declined EMS transport, please have a family member take you directly to the ED at this time. Do not delay.   Based on concerns about condition, if you do not follow  up in the ED, you may risk poor outcomes including worsening of condition, delayed treatment and potentially life threatening issues. If you have declined to go to the ED at this time, you should call your PCP immediately to set up a follow up appointment.   Go to ED for red flag symptoms, including; fevers you cannot reduce with Tylenol /Motrin, severe headaches, vision changes, numbness/weakness in part of the body, lethargy, confusion, intractable vomiting, severe dehydration, chest pain, breathing difficulty, severe persistent abdominal or pelvic pain, signs of severe infection (increased redness, swelling of an area), feeling faint or passing out, dizziness, etc. You should especially go to the ED for sudden acute worsening of condition if you do not elect to go at this time.       ED Prescriptions   None    PDMP not reviewed this encounter.   Kriste Berth, DO 12/08/23 2339

## 2023-12-02 NOTE — ED Triage Notes (Signed)
 Pt states she would like tested for heavy metals since this same issue happened in January 2025. She has some nausea since drinking her coffee this morning. She states hand tingling  and vision is not good for a while. She states that she ahs a need to know what's wrong she also would like her HGB count as well to figure it out. She does not want any of this visit going back to her PCP  Pt keep saying this morning has been [employer]

## 2023-12-02 NOTE — Discharge Instructions (Addendum)
